# Patient Record
Sex: Female | Born: 1955
Health system: Southern US, Community
[De-identification: ages and names within clinical notes are randomized; demographics above are authoritative.]

## PROBLEM LIST (undated history)

## (undated) DIAGNOSIS — M1990 Unspecified osteoarthritis, unspecified site: Secondary | ICD-10-CM

## (undated) DIAGNOSIS — K219 Gastro-esophageal reflux disease without esophagitis: Secondary | ICD-10-CM

## (undated) DIAGNOSIS — T7840XA Allergy, unspecified, initial encounter: Secondary | ICD-10-CM

## (undated) DIAGNOSIS — Z860100 Personal history of colon polyps, unspecified: Secondary | ICD-10-CM

## (undated) DIAGNOSIS — Z808 Family history of malignant neoplasm of other organs or systems: Secondary | ICD-10-CM

## (undated) DIAGNOSIS — H269 Unspecified cataract: Secondary | ICD-10-CM

## (undated) DIAGNOSIS — I10 Essential (primary) hypertension: Secondary | ICD-10-CM

## (undated) HISTORY — DX: Unspecified osteoarthritis, unspecified site: M19.90

## (undated) HISTORY — PX: CATARACT EXTRACTION, BILATERAL: SHX1313

## (undated) HISTORY — PX: FRACTURE SURGERY: SHX138

## (undated) HISTORY — DX: Family history of malignant neoplasm of other organs or systems: Z80.8

## (undated) HISTORY — DX: Gastro-esophageal reflux disease without esophagitis: K21.9

## (undated) HISTORY — DX: Allergy, unspecified, initial encounter: T78.40XA

## (undated) HISTORY — DX: Unspecified cataract: H26.9

## (undated) HISTORY — PX: ELBOW FRACTURE SURGERY: SHX616

## (undated) HISTORY — DX: Personal history of colonic polyps: Z86.010

## (undated) HISTORY — DX: Essential (primary) hypertension: I10

## (undated) HISTORY — PX: JOINT REPLACEMENT: SHX530

## (undated) HISTORY — DX: Personal history of colon polyps, unspecified: Z86.0100

## (undated) HISTORY — PX: KNEE SURGERY: SHX244

## (undated) HISTORY — PX: WRIST SURGERY: SHX841

## (undated) HISTORY — PX: ABDOMINAL HYSTERECTOMY: SHX81

---

## 2006-06-28 ENCOUNTER — Encounter: Admission: RE | Admit: 2006-06-28 | Discharge: 2006-06-28 | Payer: Self-pay | Admitting: Internal Medicine

## 2014-06-07 ENCOUNTER — Encounter (HOSPITAL_COMMUNITY): Payer: Self-pay | Admitting: Emergency Medicine

## 2014-06-07 ENCOUNTER — Observation Stay (HOSPITAL_COMMUNITY)
Admission: EM | Admit: 2014-06-07 | Discharge: 2014-06-08 | Disposition: A | Discharge status: Home / Self Care | Payer: No Typology Code available for payment source | Attending: Otolaryngology | Admitting: Otolaryngology

## 2014-06-07 ENCOUNTER — Emergency Department (HOSPITAL_COMMUNITY): Payer: No Typology Code available for payment source

## 2014-06-07 DIAGNOSIS — R1310 Dysphagia, unspecified: Secondary | ICD-10-CM | POA: Insufficient documentation

## 2014-06-07 DIAGNOSIS — J36 Peritonsillar abscess: Principal | ICD-10-CM | POA: Insufficient documentation

## 2014-06-07 LAB — I-STAT CHEM 8, ED
BUN: 15 mg/dL (ref 6–23)
CALCIUM ION: 1.19 mmol/L (ref 1.13–1.30)
Chloride: 98 mmol/L (ref 96–112)
Creatinine, Ser: 0.6 mg/dL (ref 0.50–1.10)
GLUCOSE: 117 mg/dL — AB (ref 70–99)
HCT: 48 % — ABNORMAL HIGH (ref 36.0–46.0)
HEMOGLOBIN: 16.3 g/dL — AB (ref 12.0–15.0)
Potassium: 3.6 mmol/L (ref 3.5–5.1)
SODIUM: 139 mmol/L (ref 135–145)
TCO2: 23 mmol/L (ref 0–100)

## 2014-06-07 LAB — SURGICAL PCR SCREEN
MRSA, PCR: NEGATIVE
Staphylococcus aureus: POSITIVE — AB

## 2014-06-07 MED ORDER — DEXAMETHASONE SODIUM PHOSPHATE 10 MG/ML IJ SOLN
10.0000 mg | Freq: Once | INTRAMUSCULAR | Status: AC
  Administered 2014-06-07: 10 mg via INTRAVENOUS
  Filled 2014-06-07: qty 1

## 2014-06-07 MED ORDER — DEXTROSE-NACL 5-0.45 % IV SOLN
INTRAVENOUS | Status: DC
  Administered 2014-06-07 – 2014-06-08 (×2): via INTRAVENOUS

## 2014-06-07 MED ORDER — CEFTRIAXONE SODIUM 1 G IJ SOLR
1.0000 g | Freq: Once | INTRAMUSCULAR | Status: AC
  Administered 2014-06-07: 1 g via INTRAVENOUS
  Filled 2014-06-07: qty 10

## 2014-06-07 MED ORDER — FENTANYL CITRATE 0.05 MG/ML IJ SOLN
25.0000 ug | Freq: Once | INTRAMUSCULAR | Status: AC
  Administered 2014-06-07: 25 ug via INTRAVENOUS

## 2014-06-07 MED ORDER — SODIUM CHLORIDE 0.9 % IV BOLUS (SEPSIS)
1000.0000 mL | Freq: Once | INTRAVENOUS | Status: AC
Start: 2014-06-07 — End: 2014-06-07
  Administered 2014-06-07: 1000 mL via INTRAVENOUS

## 2014-06-07 MED ORDER — FENTANYL CITRATE 0.05 MG/ML IJ SOLN
25.0000 ug | Freq: Once | INTRAMUSCULAR | Status: AC
  Administered 2014-06-07: 25 ug via INTRAVENOUS
  Filled 2014-06-07: qty 2

## 2014-06-07 MED ORDER — IOHEXOL 300 MG/ML  SOLN
75.0000 mL | Freq: Once | INTRAMUSCULAR | Status: AC | PRN
  Administered 2014-06-07: 75 mL via INTRAVENOUS

## 2014-06-07 MED ORDER — HYDROCODONE-ACETAMINOPHEN 5-325 MG PO TABS: 1.0000 | ORAL_TABLET | ORAL | Status: DC | PRN

## 2014-06-07 MED ORDER — FENTANYL CITRATE 0.05 MG/ML IJ SOLN
25.0000 ug | Freq: Once | INTRAMUSCULAR | Status: DC
  Filled 2014-06-07: qty 2

## 2014-06-07 MED ORDER — ONDANSETRON HCL 4 MG PO TABS: 4.0000 mg | ORAL_TABLET | ORAL | Status: DC | PRN

## 2014-06-07 MED ORDER — FENTANYL CITRATE 0.05 MG/ML IJ SOLN
INTRAMUSCULAR | Status: AC
  Administered 2014-06-07: 25 ug via INTRAVENOUS
  Filled 2014-06-07: qty 2

## 2014-06-07 MED ORDER — CLINDAMYCIN PHOSPHATE 600 MG/50ML IV SOLN
600.0000 mg | Freq: Three times a day (TID) | INTRAVENOUS | Status: DC
  Administered 2014-06-07 – 2014-06-08 (×2): 600 mg via INTRAVENOUS
  Filled 2014-06-07 (×4): qty 50

## 2014-06-07 MED ORDER — ONDANSETRON HCL 4 MG/2ML IJ SOLN: 4.0000 mg | INTRAMUSCULAR | Status: DC | PRN

## 2014-06-07 MED ORDER — KETOROLAC TROMETHAMINE 30 MG/ML IJ SOLN
15.0000 mg | Freq: Once | INTRAMUSCULAR | Status: AC
  Administered 2014-06-07: 15 mg via INTRAVENOUS
  Filled 2014-06-07: qty 1

## 2014-06-07 NOTE — ED Provider Notes (Signed)
CSN: [id number]     Arrival date & time 06/07/14  1126 History   First MD Initiated Contact with Patient 06/07/14 1151     Chief Complaint  Patient presents with  . Sore Throat    HPI  Patient presents with concern of increasing swelling, pain, fullness about a new lesion in her throat. Symptoms began about 4 days ago, have progressed. There is associated difficulty swallowing, slight difficult he was speaking, and perception of difficulty breathing. No fever, chills, confusion, disorientation, nausea, vomiting. Fluid intake is difficult. Patient was well prior to the onset of symptoms. Since onset no relief with OTC medication or anything else. Patient went to urgent care, was referred here for further evaluation.  History reviewed. No pertinent past medical history. History reviewed. No pertinent past surgical history. No family history on file. History  Substance Use Topics  . Smoking status: Never Smoker   . Smokeless tobacco: Not on file  . Alcohol Use: Yes   OB History    No data available     Review of Systems  Constitutional:       Per HPI, otherwise negative  HENT:       Per HPI, otherwise negative  Respiratory:       Per HPI, otherwise negative  Cardiovascular:       Per HPI, otherwise negative  Gastrointestinal: Negative for vomiting.  Endocrine:       Negative aside from HPI  Genitourinary:       Neg aside from HPI   Musculoskeletal:       Per HPI, otherwise negative  Skin: Negative.   Neurological: Negative for syncope.      Allergies  Review of patient's allergies indicates not on file.  Home Medications   Prior to Admission medications   Not on File   BP 148/96 mmHg  Pulse 111  Temp(Src) 98.9 F (37.2 C)  Resp 18  Ht 5' 8.5" (1.74 m)  Wt 199 lb (90.266 kg)  BMI 29.81 kg/m2  SpO2 95% Physical Exam  Constitutional: She is oriented to person, place, and time. She appears well-developed and well-nourished. No distress.  HENT:  Head:  Normocephalic and atraumatic.  Posterior oropharynx is not visible, even with depressed tongue via tongue depressor.   Eyes: Conjunctivae and EOM are normal.  Neck:  There is a form palpable firmness in the submandibular area, symmetric, approximately 10 cm across.   Cardiovascular: Normal rate and regular rhythm.   Pulmonary/Chest: Effort normal and breath sounds normal. No stridor. No respiratory distress.  Abdominal: She exhibits no distension.  Musculoskeletal: She exhibits no edema.  Lymphadenopathy:    She has no cervical adenopathy.  Neurological: She is alert and oriented to person, place, and time. No cranial nerve deficit.  Skin: Skin is warm and dry.  Psychiatric: She has a normal mood and affect.  Nursing note and vitals reviewed.   ED Course  Procedures (including critical care time) Labs Review Labs Reviewed  I-STAT CHEM 8, ED - Abnormal; Notable for the following:    Glucose, Bld 117 (*)    Hemoglobin 16.3 (*)    HCT 48.0 (*)    All other components within normal limits    Imaging Review Ct Soft Tissue Neck W Contrast  06/07/2014   CLINICAL DATA:  RIGHT-sided Neck swelling, pain, redness, dysphagia, muffled voice, with worsening symptoms since onset 4 days ago. Initial encounter.  EXAM: CT NECK WITH CONTRAST  TECHNIQUE: Multidetector CT imaging of the neck was  performed using the standard protocol following the bolus administration of intravenous contrast.  CONTRAST:  42mL OMNIPAQUE IOHEXOL 300 MG/ML  SOLN  COMPARISON:  None.  FINDINGS: Pharynx and larynx: There is a complex inflammatory process in the RIGHT parapharyngeal space obscuring the fat planes. The RIGHT palatine tonsil is enlarged and inflamed. There are two separate areas of hypoattenuation within the RIGHT parapharyngeal soft tissues consistent with a multicompartmental peritonsillar abscess. The smaller more lateral and superior component measures 11 x 12 x 13 mm. The larger, more inferior component  measures 22 x 20 x 24 mm. The more inferior component mildly displaces the midline tongue, and effaces the RIGHT vallecula. Mild hypopharyngeal mass effect, but no impending airway obstruction. Mild edema of the RIGHT aryepiglottic fold. Vocal cords midline.  Salivary glands: Hyper enhancement of the RIGHT submandibular gland. The inferior component of the peritonsillar abscess lies adjacent to its medial aspect. There may be a degree of mild a RIGHT SMG obstruction or edema of the duct because of the peritonsillar abscess. LEFT SMG normal. Normal parotid glands.  Thyroid: Normal.  Lymph nodes: Regional reactive adenopathy. RIGHT level IIA node up to 10 mm short axis.  Vascular: No evidence for vascular thrombosis with specific attention to the RIGHT and IJ.  Limited intracranial: Negative.  Visualized orbits: Negative.  Mastoids and visualized paranasal sinuses: Unremarkable.  Skeleton: No osseous lesion. Spondylosis is moderately advanced at the C5-6 and C6-7 levels.  Upper chest: Unremarkable.  IMPRESSION: Multi compartmental peritonsillar abscess on the RIGHT. Regional adenopathy. No impending airway obstruction. RIGHT SMG inflammation or partial obstruction. Findings discussed with ordering provider.   Electronically Signed   By: Rolla Flatten M.D.   On: 06/07/2014 13:53  I reviewed the CT, agree with the interpretation.    On re-exam the patient states that she is more comfortable.  I discussed her case w  Dr. Janace Hoard, ENT.  3:54 PM Patient will be admitted by ENT, for continued ABX, I&D as needed.  MDM   Patient p/w increasing swelling, pain in throat.  Patient is awake and alert, w no respiratory compromise.  After reviewing the CT, discussing the case w ENT, starting steroids, ABX, analgesics, she was admitted for further E/M.     Carmin Muskrat, MD 06/07/14 [phone]

## 2014-06-07 NOTE — H&P (Signed)
Laura Kaufman is an 59 y.o. female.   Chief Complaint: Sore throat HPI: 59 year old who's had a sore throat since Wednesday of this week. No previous history of tonsillitis or issues with sore throats. She has worsened over the last few days to the point of last night was severely uncomfortable. She is unable to take fluids or solids. She was seen in the emergency room today and given Rocephin and steroids. At this point she states she feels 200% better. CT scan indicated 2 areas in the right peritonsillar region consistent with peritonsillar abscess 1 was approximately 1 cm and the other 2 cm..  History reviewed. No pertinent past medical history.  History reviewed. No pertinent past surgical history.  No family history on file. Social History:  reports that she has never smoked. She does not have any smokeless tobacco history on file. She reports that she drinks alcohol. She reports that she does not use illicit drugs.  Allergies:  Allergies  Allergen Reactions  . Bee Venom Anaphylaxis  . Penicillins Shortness Of Breath     (Not in a hospital admission)  Results for orders placed or performed during the hospital encounter of 06/07/14 (from the past 48 hour(s))  I-stat Chem 8, ED     Status: Abnormal   Collection Time: 06/07/14 12:29 PM  Result Value Ref Range   Sodium 139 135 - 145 mmol/L   Potassium 3.6 3.5 - 5.1 mmol/L   Chloride 98 96 - 112 mmol/L   BUN 15 6 - 23 mg/dL   Creatinine, Ser 0.60 0.50 - 1.10 mg/dL   Glucose, Bld 117 (H) 70 - 99 mg/dL   Calcium, Ion 1.19 1.13 - 1.30 mmol/L   TCO2 23 0 - 100 mmol/L   Hemoglobin 16.3 (H) 12.0 - 15.0 g/dL   HCT 48.0 (H) 36.0 - 46.0 %   Ct Soft Tissue Neck W Contrast  06/07/2014   CLINICAL DATA:  RIGHT-sided Neck swelling, pain, redness, dysphagia, muffled voice, with worsening symptoms since onset 4 days ago. Initial encounter.  EXAM: CT NECK WITH CONTRAST  TECHNIQUE: Multidetector CT imaging of the neck was performed using the  standard protocol following the bolus administration of intravenous contrast.  CONTRAST:  82mL OMNIPAQUE IOHEXOL 300 MG/ML  SOLN  COMPARISON:  None.  FINDINGS: Pharynx and larynx: There is a complex inflammatory process in the RIGHT parapharyngeal space obscuring the fat planes. The RIGHT palatine tonsil is enlarged and inflamed. There are two separate areas of hypoattenuation within the RIGHT parapharyngeal soft tissues consistent with a multicompartmental peritonsillar abscess. The smaller more lateral and superior component measures 11 x 12 x 13 mm. The larger, more inferior component measures 22 x 20 x 24 mm. The more inferior component mildly displaces the midline tongue, and effaces the RIGHT vallecula. Mild hypopharyngeal mass effect, but no impending airway obstruction. Mild edema of the RIGHT aryepiglottic fold. Vocal cords midline.  Salivary glands: Hyper enhancement of the RIGHT submandibular gland. The inferior component of the peritonsillar abscess lies adjacent to its medial aspect. There may be a degree of mild a RIGHT SMG obstruction or edema of the duct because of the peritonsillar abscess. LEFT SMG normal. Normal parotid glands.  Thyroid: Normal.  Lymph nodes: Regional reactive adenopathy. RIGHT level IIA node up to 10 mm short axis.  Vascular: No evidence for vascular thrombosis with specific attention to the RIGHT and IJ.  Limited intracranial: Negative.  Visualized orbits: Negative.  Mastoids and visualized paranasal sinuses: Unremarkable.  Skeleton: No osseous  lesion. Spondylosis is moderately advanced at the C5-6 and C6-7 levels.  Upper chest: Unremarkable.  IMPRESSION: Multi compartmental peritonsillar abscess on the RIGHT. Regional adenopathy. No impending airway obstruction. RIGHT SMG inflammation or partial obstruction. Findings discussed with ordering provider.   Electronically Signed   By: Rolla Flatten M.D.   On: 06/07/2014 13:53    Review of Systems  Constitutional: Negative.    HENT: Positive for sore throat.   Eyes: Negative.   Respiratory: Negative.   Cardiovascular: Negative.   Skin: Negative.     Blood pressure 151/83, pulse 99, temperature 98.9 F (37.2 C), resp. rate 18, height 5' 8.5" (1.74 m), weight 90.266 kg (199 lb), SpO2 97 %. Physical Exam  Constitutional: She appears well-developed and well-nourished.  HENT:  Awake and alert. Seems to be in no distress. No stridor. Her voice sounds mostly normal but does have a very slight hot potato sound. She has no trismus. There is erythema along the right tonsil area and soft palate. There is edema of the uvula. She does have a difficult exam because she has a sensitive gag reflex and a high riding tongue. Neck is thickened but no obvious abscess palpable.  Eyes: Pupils are equal, round, and reactive to light.  Cardiovascular: Normal rate.   Respiratory: Effort normal.  GI: Soft.  Musculoskeletal: Normal range of motion.     Assessment/Plan Right peritonsillar abscess-he has erythema and swelling of the right peritonsillar region by exam but it is difficult to visualize her tonsil area and I can only see the very superior aspect of the tonsil. To perform an incision and drainage of this area especially with the inferior located abscess pocket she would require general anesthesia. We talked about this procedure. Since she is so much better since coming to the emergency room and receiving antibiotics as well as steroids observation with intravenous antibiotics also would be a acceptable option. She will prefers to proceed with the admission and observation. She will remain nothing by mouth after midnight just in case we need to proceed with the incision and drainage.  [name] 06/07/2014, 3:50 PM

## 2014-06-07 NOTE — ED Notes (Signed)
Pt. 's husband stated, We went to [facility],. And was sent here,  Really sore and tender to touch, a big infection. Making it hard to breath.

## 2014-06-08 MED ORDER — CETYLPYRIDINIUM CHLORIDE 0.05 % MT LIQD: 7.0000 mL | Freq: Two times a day (BID) | OROMUCOSAL | Status: DC

## 2014-06-08 MED ORDER — MUPIROCIN 2 % EX OINT: 1.0000 "application " | TOPICAL_OINTMENT | Freq: Two times a day (BID) | CUTANEOUS | Status: DC

## 2014-06-08 MED ORDER — CLINDAMYCIN HCL 300 MG PO CAPS: 300.0000 mg | ORAL_CAPSULE | Freq: Three times a day (TID) | ORAL | Status: DC

## 2014-06-08 MED ORDER — CHLORHEXIDINE GLUCONATE CLOTH 2 % EX PADS: 6.0000 | MEDICATED_PAD | Freq: Every day | CUTANEOUS | Status: DC

## 2014-06-08 NOTE — Progress Notes (Signed)
Pt discharged to home accomp by family.  DC instructions copy given and reviewed.  Rx called into pharmacy by Dr. Janace Hoard.  Pt is to see Dr. Janace Hoard this week, emphasized to drink plenty of fluids.  Pt sat on RA was 95%.

## 2014-06-08 NOTE — Progress Notes (Addendum)
Subjective: He feels extensively better. She's not having any fever. She feels like she can swallow her saliva.  Objective: Vital signs in last 24 hours: Temp:  [97.4 F (36.3 C)-98.9 F (37.2 C)] 98.3 F (36.8 C) (03/20 0641) Pulse Rate:  [73-112] 87 (03/20 0641) Resp:  [18] 18 (03/20 0641) BP: (122-161)/(76-96) 129/84 mmHg (03/20 0641) SpO2:  [92 %-100 %] 95 % (03/20 0641) Weight:  [90.266 kg (199 lb)] 90.266 kg (199 lb) (03/19 1815) Last BM Date: 06/05/14  Intake/Output from previous day: 03/19 0701 - 03/20 0700 In: 1729.2 [I.V.:1629.2; IV Piggyback:100] Out: -  Intake/Output this shift:    Awake and alert. She has a normal sounding voice. She looks good with no evidence of any distress. Nose is clear. Oral cavity/oropharynx-the exam is much easier. The uvula looks normal. Both tonsils are about +2 and both have small amount of exudate. I can see almost all of the tonsils now were yesterday I could not. The neck is significant decrease in swelling and no mass.  Lab Results:   Recent Labs  06/07/14 1229  HGB 16.3*  HCT 48.0*   BMET  Recent Labs  06/07/14 1229  NA 139  K 3.6  CL 98  GLUCOSE 117*  BUN 15  CREATININE 0.60   PT/INR No results for input(s): LABPROT, INR in the last 72 hours. ABG No results for input(s): PHART, HCO3 in the last 72 hours.  Invalid input(s): PCO2, PO2  Studies/Results: Ct Soft Tissue Neck W Contrast  06/07/2014   CLINICAL DATA:  RIGHT-sided Neck swelling, pain, redness, dysphagia, muffled voice, with worsening symptoms since onset 4 days ago. Initial encounter.  EXAM: CT NECK WITH CONTRAST  TECHNIQUE: Multidetector CT imaging of the neck was performed using the standard protocol following the bolus administration of intravenous contrast.  CONTRAST:  3mL OMNIPAQUE IOHEXOL 300 MG/ML  SOLN  COMPARISON:  None.  FINDINGS: Pharynx and larynx: There is a complex inflammatory process in the RIGHT parapharyngeal space obscuring the fat  planes. The RIGHT palatine tonsil is enlarged and inflamed. There are two separate areas of hypoattenuation within the RIGHT parapharyngeal soft tissues consistent with a multicompartmental peritonsillar abscess. The smaller more lateral and superior component measures 11 x 12 x 13 mm. The larger, more inferior component measures 22 x 20 x 24 mm. The more inferior component mildly displaces the midline tongue, and effaces the RIGHT vallecula. Mild hypopharyngeal mass effect, but no impending airway obstruction. Mild edema of the RIGHT aryepiglottic fold. Vocal cords midline.  Salivary glands: Hyper enhancement of the RIGHT submandibular gland. The inferior component of the peritonsillar abscess lies adjacent to its medial aspect. There may be a degree of mild a RIGHT SMG obstruction or edema of the duct because of the peritonsillar abscess. LEFT SMG normal. Normal parotid glands.  Thyroid: Normal.  Lymph nodes: Regional reactive adenopathy. RIGHT level IIA node up to 10 mm short axis.  Vascular: No evidence for vascular thrombosis with specific attention to the RIGHT and IJ.  Limited intracranial: Negative.  Visualized orbits: Negative.  Mastoids and visualized paranasal sinuses: Unremarkable.  Skeleton: No osseous lesion. Spondylosis is moderately advanced at the C5-6 and C6-7 levels.  Upper chest: Unremarkable.  IMPRESSION: Multi compartmental peritonsillar abscess on the RIGHT. Regional adenopathy. No impending airway obstruction. RIGHT SMG inflammation or partial obstruction. Findings discussed with ordering provider.   Electronically Signed   By: Rolla Flatten M.D.   On: 06/07/2014 13:53    Anti-infectives: Anti-infectives    Start  Dose/Rate Route Frequency Ordered Stop   06/07/14 2200  clindamycin (CLEOCIN) IVPB 600 mg     600 mg 100 mL/hr over 30 Minutes Intravenous 3 times per day 06/07/14 1750     06/07/14 1400  cefTRIAXone (ROCEPHIN) 1 g in dextrose 5 % 50 mL IVPB     1 g 100 mL/hr over 30  Minutes Intravenous  Once 06/07/14 1359 06/07/14 1443      Assessment/Plan: s/p * No surgery found * We talked about her situation and her abscess that showed up on the CT scan. Since she is so much better and exam is somewhat better then an option of continued medical therapy would be appropriate. She will go home on clindamycin. She will follow-up if she notices any decrease in her improvement or any regression where she gets worse. She will follow-up in the office next week provided she continues to improve every day to the point where she should be almost normal by Tuesday.     [name] 06/08/2014 She was having some apparent decrease in her O2 sat and some nasal cannulas were placed. She will remove those and if her sats continued to drop then she will need a medical consultation for evaluation of her lungs as this is not secondary to any upper airway issue.

## 2014-06-08 NOTE — Discharge Summary (Signed)
Physician Discharge Summary  Patient ID: Laura Kaufman MRN: [id number] DOB/AGE: [birth year] 59 y.o.  Admit date: 06/07/2014 Discharge date: 06/08/2014  Admission Diagnoses: Right peritonsillar abscess  Discharge Diagnoses: Same Active Problems:   Peritonsillar abscess   Discharged Condition: good  Hospital Course: Patient was admitted to the hospital for intravenous antibiotics with a right small peritonsillar abscess. She has dramatically improved since admission and now feels like she can go home and take fluids well. She had a much better examination on hospital day 1 and was discharged on clindamycin to follow-up in the office this week. She will follow-up sooner if she has any worsening or stabilization of her symptoms. She was needing a small amount of oxygen so that will be removed and see if her sats stay up. If not she will need a medical consultation before discharge.  Consults: None  Significant Diagnostic Studies: 0  Treatments: 0  Discharge Exam: Blood pressure 129/84, pulse 87, temperature 98.3 F (36.8 C), temperature source Oral, resp. rate 18, height 5' 8.5" (1.74 m), weight 90.266 kg (199 lb), SpO2 95 %. Awake and alert. No distress. Voice is now completely normal. Oral cavity/oropharynx-the exam is much better able to see the full tonsil exam bilaterally. Both tonsils have a small amount of exudate. They're both about the same size. The uvula has no edema. Neck is without significant swelling or mass. Cardiovascular is regular. Lungs are clear. extremities without tenderness or swelling  Disposition: Final discharge disposition not confirmed  Discharge Instructions    Call MD for:  difficulty breathing, headache or visual disturbances    Complete by:  As directed      Call MD for:  extreme fatigue    Complete by:  As directed      Call MD for:  hives    Complete by:  As directed      Call MD for:  persistant dizziness or light-headedness    Complete by:  As  directed      Call MD for:  persistant nausea and vomiting    Complete by:  As directed      Call MD for:  redness, tenderness, or signs of infection (pain, swelling, redness, odor or green/yellow discharge around incision site)    Complete by:  As directed      Call MD for:  severe uncontrolled pain    Complete by:  As directed      Call MD for:  temperature >100.4    Complete by:  As directed      Diet - low sodium heart healthy    Complete by:  As directed      Discharge instructions    Complete by:  As directed   You should improve every day and be almost back to normal by 24-48 hours. Follow-up in my office this week. Call if there's any worsening or if you are not almost totally better by Tuesday. Normal diet.     Increase activity slowly    Complete by:  As directed             Medication List    TAKE these medications        clindamycin 300 MG capsule  Commonly known as:  CLEOCIN  Take 1 capsule (300 mg total) by mouth 3 (three) times daily.     naproxen sodium 220 MG tablet  Commonly known as:  ANAPROX  Take 440 mg by mouth 2 (two) times daily with a meal.  SignedMelissa Montane 06/08/2014, 9:35 AM

## 2014-06-08 NOTE — Progress Notes (Signed)
UR completed 

## 2014-12-18 ENCOUNTER — Emergency Department (HOSPITAL_COMMUNITY): Payer: No Typology Code available for payment source

## 2014-12-18 ENCOUNTER — Observation Stay (HOSPITAL_COMMUNITY)
Admission: EM | Admit: 2014-12-18 | Discharge: 2014-12-21 | Disposition: A | Discharge status: Home / Self Care | Payer: No Typology Code available for payment source | Attending: Family Medicine | Admitting: Family Medicine

## 2014-12-18 ENCOUNTER — Encounter (HOSPITAL_COMMUNITY): Payer: Self-pay

## 2014-12-18 DIAGNOSIS — Y9389 Activity, other specified: Secondary | ICD-10-CM | POA: Insufficient documentation

## 2014-12-18 DIAGNOSIS — Z91030 Bee allergy status: Secondary | ICD-10-CM | POA: Diagnosis not present

## 2014-12-18 DIAGNOSIS — M545 Low back pain, unspecified: Secondary | ICD-10-CM

## 2014-12-18 DIAGNOSIS — Y92010 Kitchen of single-family (private) house as the place of occurrence of the external cause: Secondary | ICD-10-CM | POA: Insufficient documentation

## 2014-12-18 DIAGNOSIS — W19XXXA Unspecified fall, initial encounter: Secondary | ICD-10-CM | POA: Insufficient documentation

## 2014-12-18 DIAGNOSIS — J309 Allergic rhinitis, unspecified: Secondary | ICD-10-CM | POA: Diagnosis not present

## 2014-12-18 DIAGNOSIS — K5900 Constipation, unspecified: Secondary | ICD-10-CM | POA: Diagnosis not present

## 2014-12-18 DIAGNOSIS — Y998 Other external cause status: Secondary | ICD-10-CM | POA: Insufficient documentation

## 2014-12-18 DIAGNOSIS — S32000A Wedge compression fracture of unspecified lumbar vertebra, initial encounter for closed fracture: Secondary | ICD-10-CM | POA: Diagnosis present

## 2014-12-18 DIAGNOSIS — S22088A Other fracture of T11-T12 vertebra, initial encounter for closed fracture: Secondary | ICD-10-CM | POA: Diagnosis not present

## 2014-12-18 DIAGNOSIS — S32018A Other fracture of first lumbar vertebra, initial encounter for closed fracture: Secondary | ICD-10-CM | POA: Diagnosis not present

## 2014-12-18 DIAGNOSIS — Z90710 Acquired absence of both cervix and uterus: Secondary | ICD-10-CM | POA: Insufficient documentation

## 2014-12-18 DIAGNOSIS — R339 Retention of urine, unspecified: Secondary | ICD-10-CM | POA: Insufficient documentation

## 2014-12-18 DIAGNOSIS — Z880 Allergy status to penicillin: Secondary | ICD-10-CM | POA: Diagnosis not present

## 2014-12-18 DIAGNOSIS — R338 Other retention of urine: Secondary | ICD-10-CM | POA: Insufficient documentation

## 2014-12-18 DIAGNOSIS — M549 Dorsalgia, unspecified: Secondary | ICD-10-CM | POA: Diagnosis present

## 2014-12-18 DIAGNOSIS — W010XXA Fall on same level from slipping, tripping and stumbling without subsequent striking against object, initial encounter: Secondary | ICD-10-CM

## 2014-12-18 DIAGNOSIS — S32019A Unspecified fracture of first lumbar vertebra, initial encounter for closed fracture: Secondary | ICD-10-CM | POA: Diagnosis present

## 2014-12-18 LAB — BASIC METABOLIC PANEL
Anion gap: 10 (ref 5–15)
BUN: 7 mg/dL (ref 6–20)
CO2: 25 mmol/L (ref 22–32)
CREATININE: 0.83 mg/dL (ref 0.44–1.00)
Calcium: 9.7 mg/dL (ref 8.9–10.3)
Chloride: 104 mmol/L (ref 101–111)
GFR calc Af Amer: >60 mL/min (ref >60)
GFR calc non Af Amer: >60 mL/min (ref >60)
GLUCOSE: 140 mg/dL — AB (ref 65–99)
Potassium: 4 mmol/L (ref 3.5–5.1)
Sodium: 139 mmol/L (ref 135–145)

## 2014-12-18 LAB — CBC
HEMATOCRIT: 46.3 % — AB (ref 36.0–46.0)
Hemoglobin: 15.9 g/dL — ABNORMAL HIGH (ref 12.0–15.0)
MCH: 28.4 pg (ref 26.0–34.0)
MCHC: 34.3 g/dL (ref 30.0–36.0)
MCV: 82.8 fL (ref 78.0–100.0)
Platelets: 205 10*3/uL (ref 150–400)
RBC: 5.59 MIL/uL — ABNORMAL HIGH (ref 3.87–5.11)
RDW: 12.7 % (ref 11.5–15.5)
WBC: 15.5 10*3/uL — ABNORMAL HIGH (ref 4.0–10.5)

## 2014-12-18 MED ORDER — OXYCODONE-ACETAMINOPHEN 5-325 MG PO TABS
1.0000 | ORAL_TABLET | Freq: Once | ORAL | Status: AC
  Administered 2014-12-18: 1 via ORAL
  Filled 2014-12-18: qty 1

## 2014-12-18 MED ORDER — POLYETHYLENE GLYCOL 3350 17 G PO PACK
17.0000 g | PACK | Freq: Every day | ORAL | Status: DC | PRN
  Administered 2014-12-20: 17 g via ORAL
  Filled 2014-12-18: qty 1

## 2014-12-18 MED ORDER — HYDROMORPHONE HCL 1 MG/ML IJ SOLN
0.5000 mg | INTRAMUSCULAR | Status: DC
  Administered 2014-12-18 – 2014-12-19 (×4): 0.5 mg via INTRAVENOUS
  Filled 2014-12-18 (×4): qty 1

## 2014-12-18 MED ORDER — HYDROMORPHONE HCL 1 MG/ML IJ SOLN
1.0000 mg | Freq: Once | INTRAMUSCULAR | Status: AC
  Administered 2014-12-18: 1 mg via INTRAMUSCULAR
  Filled 2014-12-18: qty 1

## 2014-12-18 MED ORDER — SODIUM CHLORIDE 0.9 % IV SOLN
Freq: Once | INTRAVENOUS | Status: AC
  Administered 2014-12-18: 21:00:00 via INTRAVENOUS

## 2014-12-18 MED ORDER — METHOCARBAMOL 500 MG PO TABS
500.0000 mg | ORAL_TABLET | Freq: Once | ORAL | Status: AC
  Administered 2014-12-18: 500 mg via ORAL
  Filled 2014-12-18: qty 1

## 2014-12-18 MED ORDER — HYDROMORPHONE HCL 1 MG/ML IJ SOLN
1.0000 mg | INTRAMUSCULAR | Status: DC | PRN
  Administered 2014-12-18 – 2014-12-19 (×3): 1 mg via INTRAVENOUS
  Filled 2014-12-18 (×3): qty 1

## 2014-12-18 MED ORDER — HYDROMORPHONE HCL 1 MG/ML IJ SOLN
1.0000 mg | Freq: Once | INTRAMUSCULAR | Status: AC
  Administered 2014-12-18: 1 mg via INTRAVENOUS
  Filled 2014-12-18: qty 1

## 2014-12-18 MED ORDER — NAPROXEN 250 MG PO TABS
500.0000 mg | ORAL_TABLET | Freq: Two times a day (BID) | ORAL | Status: DC
  Administered 2014-12-19 – 2014-12-21 (×5): 500 mg via ORAL
  Filled 2014-12-18 (×5): qty 2

## 2014-12-18 MED ORDER — DIPHENHYDRAMINE HCL 25 MG PO CAPS
50.0000 mg | ORAL_CAPSULE | Freq: Once | ORAL | Status: AC
  Administered 2014-12-18: 50 mg via ORAL
  Filled 2014-12-18: qty 2

## 2014-12-18 MED ORDER — DIPHENHYDRAMINE HCL 25 MG PO CAPS: 50.0000 mg | ORAL_CAPSULE | Freq: Four times a day (QID) | ORAL | Status: DC | PRN

## 2014-12-18 MED ORDER — ENOXAPARIN SODIUM 40 MG/0.4ML ~~LOC~~ SOLN
40.0000 mg | SUBCUTANEOUS | Status: DC
Start: 2014-12-18 — End: 2014-12-21
  Administered 2014-12-18 – 2014-12-20 (×3): 40 mg via SUBCUTANEOUS
  Filled 2014-12-18 (×3): qty 0.4

## 2014-12-18 NOTE — ED Notes (Signed)
Patient transported to CT 

## 2014-12-18 NOTE — ED Notes (Signed)
ortho tech paged. 

## 2014-12-18 NOTE — H&P (Signed)
[facility] Admission History and Physical Service Pager: [phone]  Patient name: Laura Kaufman Medical record number: [id number] Date of birth: 29-May-1955 Age: 59 y.o. Gender: female  Primary Care Provider: No PCP Per Patient Consultants: None Code Status: Full   Chief Complaint: Back Pain   Assessment and Plan: Elfrida Gracy is a 59 y.o. female presenting with back pain s/p fall. PMH is significant for allergic rhinitis, s/p patella replacement, and right peritonsillar abscess.   T12 & L1 Compression Fractures: Back pain associated with recent fall. Lumbar spine Xray showed endplate degenerative change with inferior T12 and superior L1 minimal endplate depression, no bony retropulsion. Xray of thoracic spine was negative. CT lumbar spine: Acute anterior compression fracture of T12 vertebral body with minimal anterior height loss, age-indeterminate superior endplate anterior compression deformity of L1, and bilateral spondylolysis L5 without spondylolisthesis. Neurosurgery recommended lumbar corset. Patient had inadequate pain control in the ED. -admit to MedSurg; vital signs per unit -pain control: Dilaudid 0.5mg  q4h, Dilaudid 1mg  q3h for breakthrough pain and Naproxen 500 mg BID  -heating bad to back prn  -continue with lumbar corset -Miralax prn for constipation 2/2 to pain medication  -PT consult   Elevated Blood Pressure: Patient does not have a diagnosis of HTN. Pressures were stable (120s-140s/70-80s) throughout the day in the ED. Had two elevated pressures in the ED with systolics in the 371G reportedly when patient was having IV placed. Likely elevated 2/2 to pain. Suspect will improve with adequate pain control.  -will continue to monitor  Leukocytosis: WBC 15.5. Patient is afebrile and not exhibiting any signs of infection. RBC, HgB and Hct elevated as well so suspect related to hemoconcentration 2/2 to dehydration.  -NS 588mL bolus  -encouraged  good PO intake -repeat CBC in AM   Allergic Rhinitis -continue home Benadryl   FEN/GI: Regular Diet, NS 500 mL bolus then SLIV Prophylaxis: Lovenox 40mg    Disposition: Admit to MedSurg for Observation; FPTS [place]; Home pending pain control and PT consult   History of Present Illness:  Laura Kaufman is a 59 y.o. female presenting with back pain s/p fall this AM around 6:30. She slipped on cat urine and fell backwards, landing on her back and hitting her head. She denies LOC s/p fall. Denies headache or neck pain. Back pain does not radiate down her legs. No numbness or tingling in arms or legs. No bowel or bladder incontinence after fall. Pain is 10/10 without pain medication and 5/10 after dilaudid.   CT of lumbar spine demonstrated compression fractures of T12 and L1. Neurosurgery was asked to evaluate the patient. They recommended lumbar corset. She was given Dilaudid 1 mg x 3 doses, Percocet 5-325 mg x 1 dose, and Robaxin 500 mg x 1 dose in ED without adequate pain control.   She has not eaten for most of today or taken adequate fluids. When she tried to ambulate in the ED, she felt light headed.   Review Of Systems: Per HPI with the following additions: None  Otherwise 12 point review of systems was performed and was unremarkable.  Patient Active Problem List   Diagnosis Date Noted  . L1 vertebral fracture 12/18/2014  . Peritonsillar abscess 06/07/2014   Past Medical History: History reviewed. No pertinent past medical history. Past Surgical History: Past Surgical History  Procedure Laterality Date  . Abdominal hysterectomy    . Knee surgery    . Wrist surgery     Social History: Social History  Substance Use  Topics  . Smoking status: Never Smoker   . Smokeless tobacco: None  . Alcohol Use: Yes     Comment: occasionally   Additional social history: none   Please also refer to relevant sections of EMR.  Family History: History reviewed. No pertinent family  history. Allergies and Medications: Allergies  Allergen Reactions  . Bee Venom Anaphylaxis  . Penicillins Anaphylaxis and Shortness Of Breath    Has patient had a PCN reaction causing immediate rash, facial/tongue/throat swelling, SOB or lightheadedness with hypotension: Yesyes Has patient had a PCN reaction causing severe rash involving mucus membranes or skin necrosis: Yesyes Has patient had a PCN reaction that required hospitalization Yesyes Has patient had a PCN reaction occurring within the last 10 years: Jolyne Loa If all of the above answers are "NO", then may proceed with Cephalospor   No current facility-administered medications on file prior to encounter.   Current Outpatient Prescriptions on File Prior to Encounter  Medication Sig Dispense Refill  . clindamycin (CLEOCIN) 300 MG capsule Take 1 capsule (300 mg total) by mouth 3 (three) times daily. (Patient not taking: Reported on 12/18/2014) 30 capsule 0  . naproxen sodium (ANAPROX) 220 MG tablet Take 440 mg by mouth 2 (two) times daily with a meal.      Objective: BP 160/102 mmHg  Pulse 94  Temp(Src) 98.3 F (36.8 C) (Oral)  Resp 18  Ht 5\' 5"  (1.651 m)  Wt 210 lb (95.255 kg)  BMI 34.95 kg/m2  SpO2 93% Exam: General: WDWN female lying in bed in NAD  Eyes: EOMI. Pupils equal and round.  ENTM: Oropharynx clear.  Neck: Full ROM.  Cardiovascular: RRR. No murmurs appreciated.  Respiratory: CTAB anterior  Abdomen: soft, NTND  MSK: Moves all 4 extremities.  Skin: Warm and dry  Neuro: Alert and oriented. No gross deficits. Strength 5/5 in UE and LE bilaterally.  Psych: Tearful. Answers questions appropriately.   Labs and Imaging: CBC BMET   Recent Labs Lab 12/18/14 1535  WBC 15.5*  HGB 15.9*  HCT 46.3*  PLT 205    Recent Labs Lab 12/18/14 1535  NA 139  K 4.0  CL 104  CO2 25  BUN 7  CREATININE 0.83  GLUCOSE 140*  CALCIUM 9.7     Dg Thoracic Spine 2 View  12/18/2014   CLINICAL DATA:  Slipped and fell, mid  to low back pain  EXAM: THORACIC SPINE 2 VIEWS  COMPARISON:  None.  FINDINGS: There is no evidence of thoracic spine fracture. Alignment is normal. No other significant bone abnormalities are identified.  IMPRESSION: Negative.   Electronically Signed   By: Conchita Paris M.D.   On: 12/18/2014 13:14   Dg Lumbar Spine Complete  12/18/2014   CLINICAL DATA:  Slipped and fell, mid to low back pain, stiffness and limited range of motion  EXAM: LUMBAR SPINE - COMPLETE 4+ VIEW  COMPARISON:  None.  FINDINGS: 5 non rib-bearing lumbar type vertebral bodies are identified. Endplate degenerative change with minimal template compression deformity at inferior aspect T12 and superior aspect L1 noted. No bony retropulsion. Alignment is within normal limits. Minimal atheromatous aortic calcification. Probable gallstone partly visualized.  IMPRESSION: Endplate degenerative change with inferior T12 and superior L1 minimal endplate depression, of unknown chronicity. No bony retropulsion.   Electronically Signed   By: Conchita Paris M.D.   On: 12/18/2014 13:26   Ct Lumbar Spine Wo Contrast  12/18/2014   CLINICAL DATA:  [place] this morning, had to crawl through house  to get a phone, T12-L1 fracture  EXAM: CT LUMBAR SPINE WITHOUT CONTRAST  TECHNIQUE: Multidetector CT imaging of the lumbar spine was performed without intravenous contrast administration. Multiplanar CT image reconstructions were also generated.  COMPARISON:  Radiographs 12/18/2014  FINDINGS: Five non rib-bearing lumbar type vertebra by prior radiographs.  Minimal atherosclerotic calcifications.  Visualized retroperitoneal structures otherwise unremarkable.  Bones appear demineralized.  Anterior compression fracture T12 involving the inferior endplate with additional mild superior endplate concavity/compression deformity, resulting in mild anterior height loss.  No retropulsion of fragments.  Mild anterior height loss L1 vertebral body due to age indeterminate superior  endplate deformity.  Remaining lumbar vertebra demonstrate normal height and alignment.  No subluxation or bone destruction.  BILATERAL spondylolysis L5 without spondylolisthesis.  Minimally bulging discs inferior lumbar spine without neural compression or definite focal disc herniation  IMPRESSION: Acute anterior compression fracture of T12 vertebral body with minimal anterior height loss.  Age-indeterminate superior endplate anterior compression deformity of L1.  BILATERAL spondylolysis L5 without spondylolisthesis.   Electronically Signed   By: Lavonia Dana M.D.   On: 12/18/2014 16:55    Nicolette Bang, DO 12/18/2014, 7:26 PM PGY-1, [place] Intern pager: [phone], text pages welcome  I have read and agree with the amended note as above.  Phill Myron, MD, PGY-3 2:20 AM

## 2014-12-18 NOTE — ED Provider Notes (Signed)
S: Laura Kaufman is a 59 y.o. female presents to the ED with mid low back pain after a fall this morning. Patient with mechanical fall after slipping on a wet spot. She reports that her back hurt so much that she was unable to stand on her own. EMS was called and brought her here to the emergency room. Patient reports she did hit her head but had no loss of consciousness. She has no vision changes, headache, weakness, numbness. She does not take blood thinners.  O:  General: Awake  HEENT: Atraumatic  Resp: Normal effort  Cardiac: RRR Abd: Nondistended, soft  Neuro:No focal weakness  MSK: midline T12-L1   A/P:  Pt with focal midline back pain after fall. Will obtain plain films as I have concern for fracture. No neurologic deficits.  2:31 PM X-ray with T12-L1 endplate deformity. Patient has pinpoint tenderness here and this is likely a new fracture. Will consult with neurosurgery for brace recommendation and to ensure outpatient follow-up.  3:34 PM Pt discussed with Dr. Beatris Ship.  CT L-spine ordered to assess for further fracture.  Lumbar corset insufficient if fracture is small however if patient has burst fracture she will need aspirin TLSO brace.  Further pain control given.  Care transferred to Dr. Regenia Skeeter.  If patient is able to ambulate after increased pain control and reassuring CT she may be discharged home otherwise she will need admission for further pain control. Patient remains without neurologic deficit.  BP 130/72 mmHg  Pulse 96  Temp(Src) 98.3 F (36.8 C) (Oral)  Resp 19  Ht 5\' 5"  (1.651 m)  Wt 210 lb (95.255 kg)  BMI 34.95 kg/m2  SpO2 90%  Pt was seen by Arlean Hopping, PA-C and personally evaluated by myself with Evelina Bucy, MD supervising.      Jarrett Soho Muthersbaugh, PA-C 12/18/14 [place], MD 12/18/14 [phone]

## 2014-12-18 NOTE — ED Notes (Signed)
[employer] at bedside. 

## 2014-12-18 NOTE — ED Notes (Signed)
Unable to obtain os vs due to pain, unable to ambulate for same reason.

## 2014-12-18 NOTE — Progress Notes (Signed)
Orthopedic Tech Progress Note Patient Details:  Laura Kaufman 03-08-1956 [id number] Called [employer] for brace Patient ID: Laura Kaufman, female   DOB: Jun 14, 1955, 59 y.o.   MRN: [id number]   Braulio Bosch 12/18/2014, 3:31 PM

## 2014-12-18 NOTE — ED Provider Notes (Signed)
CSN: [id number]     Arrival date & time 12/18/14  1159 History   First MD Initiated Contact with Patient 12/18/14 1201     Chief Complaint  Patient presents with  . Fall     Patient is a 59 y.o. female presenting with back pain and fall.  Back Pain Location:  Lumbar spine and sacro-iliac joint Quality:  Aching Radiates to:  Does not radiate Pain severity:  Mild Pain is:  Same all the time Onset quality:  Sudden Duration:  6 hours Timing:  Constant Progression:  Unchanged Chronicity:  New Context: falling   Relieved by:  Nothing Worsened by:  Movement and palpation Ineffective treatments:  Being still Associated symptoms: no abdominal pain, no bladder incontinence, no bowel incontinence, no headaches, no leg pain, no numbness, no pelvic pain, no perianal numbness, no tingling and no weakness   Risk factors: obesity   Fall This is a new problem. The current episode started today. Pertinent negatives include no abdominal pain, diaphoresis, headaches, neck pain, numbness or weakness.     Laura Kaufman is a 59 y.o. female presents today with lower back pain following a slip and fall at around 0630 this morning. Pt slipped on a puddle of liquid on the hardwood floor, fell backwards, and hit her head on the floor.  No loss of consciousness, dizziness/lightheadedness, nausea/vomiting. Pt denies any numbness, tingling or weakness. Pt denies taking any anticoagulation medication. Pt denies any previous falls or back injuries. Patient states she feels more comfortable lying on her side. Rates her pain as a 2/10 when lying on her back, "over a 5/10" when moving, and 0/10 when lying on her side.  History reviewed. No pertinent past medical history. Past Surgical History  Procedure Laterality Date  . Abdominal hysterectomy    . Knee surgery    . Wrist surgery     History reviewed. No pertinent family history. Social History  Substance Use Topics  . Smoking status: Never Smoker   .  Smokeless tobacco: None  . Alcohol Use: Yes     Comment: occasionally   OB History    No data available     Review of Systems  Constitutional: Negative for diaphoresis.  Eyes: Negative for visual disturbance.  Respiratory: Negative for shortness of breath.   Gastrointestinal: Negative for abdominal pain and bowel incontinence.  Genitourinary: Negative for bladder incontinence and pelvic pain.  Musculoskeletal: Positive for back pain. Negative for neck pain and neck stiffness.  Neurological: Negative for dizziness, tingling, syncope, speech difficulty, weakness, numbness and headaches.  All other systems reviewed and are negative.     Allergies  Bee venom and Penicillins  Home Medications   Prior to Admission medications   Medication Sig Start Date End Date Taking? Authorizing Provider  diphenhydrAMINE (BENADRYL) 25 mg capsule Take 50 mg by mouth every 6 (six) hours as needed.   Yes Historical Provider, MD  pseudoephedrine (SUDAFED) 30 MG tablet Take 30 mg by mouth every 4 (four) hours as needed for congestion (allergies).   Yes Historical Provider, MD  clindamycin (CLEOCIN) 300 MG capsule Take 1 capsule (300 mg total) by mouth 3 (three) times daily. Patient not taking: Reported on 12/18/2014 06/08/14   [name], MD  naproxen sodium (ANAPROX) 220 MG tablet Take 440 mg by mouth 2 (two) times daily with a meal.    Historical Provider, MD   BP 130/72 mmHg  Pulse 96  Temp(Src) 98.3 F (36.8 C) (Oral)  Resp 19  Ht  5\' 5"  (1.651 m)  Wt 210 lb (95.255 kg)  BMI 34.95 kg/m2  SpO2 90% Physical Exam  Constitutional: She is oriented to person, place, and time. She appears well-developed and well-nourished. No distress.  HENT:  Head: Normocephalic and atraumatic.  Eyes: Conjunctivae and EOM are normal. Pupils are equal, round, and reactive to light.  Neck: Normal range of motion. Neck supple.  Cardiovascular: Normal rate, regular rhythm and normal heart sounds.   Pulmonary/Chest:  Effort normal and breath sounds normal. She exhibits no tenderness.  Abdominal: Soft. Normal appearance. There is no tenderness.  Musculoskeletal: Normal range of motion.       Lumbar back: She exhibits tenderness and pain. She exhibits no deformity.  Neurological: She is alert and oriented to person, place, and time. She has normal reflexes. No cranial nerve deficit or sensory deficit. She exhibits normal muscle tone. Coordination normal. GCS eye subscore is 4. GCS verbal subscore is 5. GCS motor subscore is 6.  Skin: Skin is warm and dry. She is not diaphoretic.  Nursing note and vitals reviewed.   ED Course  Procedures (including critical care time) Labs Review Labs Reviewed  CBC - Abnormal; Notable for the following:    WBC 15.5 (*)    RBC 5.59 (*)    Hemoglobin 15.9 (*)    HCT 46.3 (*)    All other components within normal limits  BASIC METABOLIC PANEL    Imaging Review Dg Thoracic Spine 2 View  12/18/2014   CLINICAL DATA:  Slipped and fell, mid to low back pain  EXAM: THORACIC SPINE 2 VIEWS  COMPARISON:  None.  FINDINGS: There is no evidence of thoracic spine fracture. Alignment is normal. No other significant bone abnormalities are identified.  IMPRESSION: Negative.   Electronically Signed   By: Conchita Paris M.D.   On: 12/18/2014 13:14   Dg Lumbar Spine Complete  12/18/2014   CLINICAL DATA:  Slipped and fell, mid to low back pain, stiffness and limited range of motion  EXAM: LUMBAR SPINE - COMPLETE 4+ VIEW  COMPARISON:  None.  FINDINGS: 5 non rib-bearing lumbar type vertebral bodies are identified. Endplate degenerative change with minimal template compression deformity at inferior aspect T12 and superior aspect L1 noted. No bony retropulsion. Alignment is within normal limits. Minimal atheromatous aortic calcification. Probable gallstone partly visualized.  IMPRESSION: Endplate degenerative change with inferior T12 and superior L1 minimal endplate depression, of unknown  chronicity. No bony retropulsion.   Electronically Signed   By: Conchita Paris M.D.   On: 12/18/2014 13:26   I have personally reviewed and evaluated these images and lab results as part of my medical decision-making.   EKG Interpretation None      MDM   Final diagnoses:  None    Laura Kaufman presents with lower back pain following a slip and fall. No neurologic deficits or LOC.  Lumbar xray reveals T12 and L1 endplate deformity.  Able to ambulate a few feet before having what appeared to be a vagal response with lightheadedness, diaphoresis, nausea and pallor.  Symptoms resolved once movement, and therefore her pain, ceased.  After dilaudid administration, patient's pain was reduced to 0/10. When another patient ambulation was attempted, patient had what appeared to be another vagal response and a significant increase in pain. Patient was advised that she may have to be admitted overnight for pain management.  Lorayne Bender, PA-C 12/18/14 1602  Evelina Bucy, MD 12/18/14 [phone]

## 2014-12-18 NOTE — ED Notes (Signed)
Dr Mingo Amber in room and he gave verbal order to give pt oral benadryll

## 2014-12-18 NOTE — ED Notes (Signed)
Lumbar corsett at bedside. Rep has demonstrated use and instructions to patient.  Patient does not wear when laying in bed, only when up moving around.

## 2014-12-18 NOTE — ED Notes (Signed)
Patient returned from xray.

## 2014-12-18 NOTE — ED Notes (Signed)
Pt reports she slipped and fell in the kitchen this morning injuring her middle back.  Pt denies LOC or n/v.  Pt reports she landed on a kitchen rug which helped with the fall.  Pt is not on  Blood thinners.  No obvious deformity/injury.

## 2014-12-18 NOTE — ED Provider Notes (Signed)
Patient care transferred to me. CT scan shows compression fractures but no burst fractures or spinal cord impingement. Neurologically intact. Plan to place in a corset per Dr. Saintclair Halsted. Pain is not controlled and she is unable to get up and walk due to the severity of pain. Plan to admit to family practice for observation for better pain control.  Sherwood Gambler, MD 12/18/14 [phone]

## 2014-12-18 NOTE — ED Notes (Signed)
Pt requested benadryl for "stuffy nose" Pt states that she takes it every evening.

## 2014-12-18 NOTE — Progress Notes (Signed)
Pt. Arrived alert and oriented to unit. Oriented to room. Call bell at side. Bed alarm activated. Questions answered. Will continue to monitor. Bobbye Charleston, RN

## 2014-12-19 ENCOUNTER — Observation Stay (HOSPITAL_COMMUNITY): Payer: No Typology Code available for payment source

## 2014-12-19 DIAGNOSIS — W1839XA Other fall on same level, initial encounter: Secondary | ICD-10-CM | POA: Diagnosis not present

## 2014-12-19 DIAGNOSIS — M545 Low back pain, unspecified: Secondary | ICD-10-CM | POA: Insufficient documentation

## 2014-12-19 DIAGNOSIS — R338 Other retention of urine: Secondary | ICD-10-CM | POA: Diagnosis not present

## 2014-12-19 DIAGNOSIS — S32018A Other fracture of first lumbar vertebra, initial encounter for closed fracture: Secondary | ICD-10-CM | POA: Diagnosis not present

## 2014-12-19 DIAGNOSIS — W010XXA Fall on same level from slipping, tripping and stumbling without subsequent striking against object, initial encounter: Secondary | ICD-10-CM | POA: Insufficient documentation

## 2014-12-19 DIAGNOSIS — S32019A Unspecified fracture of first lumbar vertebra, initial encounter for closed fracture: Secondary | ICD-10-CM | POA: Diagnosis not present

## 2014-12-19 DIAGNOSIS — S32000A Wedge compression fracture of unspecified lumbar vertebra, initial encounter for closed fracture: Secondary | ICD-10-CM | POA: Insufficient documentation

## 2014-12-19 LAB — URINALYSIS, ROUTINE W REFLEX MICROSCOPIC
BILIRUBIN URINE: NEGATIVE
Glucose, UA: NEGATIVE mg/dL
KETONES UR: NEGATIVE mg/dL
Leukocytes, UA: NEGATIVE
NITRITE: NEGATIVE
Protein, ur: NEGATIVE mg/dL
Specific Gravity, Urine: 1.02 (ref 1.005–1.030)
UROBILINOGEN UA: 0.2 mg/dL (ref 0.0–1.0)
pH: 6 (ref 5.0–8.0)

## 2014-12-19 LAB — CBC
HCT: 43.6 % (ref 36.0–46.0)
Hemoglobin: 13.9 g/dL (ref 12.0–15.0)
MCH: 27 pg (ref 26.0–34.0)
MCHC: 31.9 g/dL (ref 30.0–36.0)
MCV: 84.7 fL (ref 78.0–100.0)
PLATELETS: 196 10*3/uL (ref 150–400)
RBC: 5.15 MIL/uL — AB (ref 3.87–5.11)
RDW: 13.1 % (ref 11.5–15.5)
WBC: 9.1 10*3/uL (ref 4.0–10.5)

## 2014-12-19 LAB — URINE MICROSCOPIC-ADD ON

## 2014-12-19 LAB — BASIC METABOLIC PANEL
ANION GAP: 8 (ref 5–15)
BUN: 8 mg/dL (ref 6–20)
CHLORIDE: 102 mmol/L (ref 101–111)
CO2: 31 mmol/L (ref 22–32)
Calcium: 9.5 mg/dL (ref 8.9–10.3)
Creatinine, Ser: 0.86 mg/dL (ref 0.44–1.00)
GFR calc non Af Amer: >60 mL/min (ref >60)
GLUCOSE: 125 mg/dL — AB (ref 65–99)
Potassium: 4.3 mmol/L (ref 3.5–5.1)
Sodium: 141 mmol/L (ref 135–145)

## 2014-12-19 MED ORDER — HYDROMORPHONE HCL 1 MG/ML IJ SOLN
0.5000 mg | INTRAMUSCULAR | Status: DC | PRN
  Administered 2014-12-19 – 2014-12-20 (×2): 0.5 mg via INTRAVENOUS
  Filled 2014-12-19 (×2): qty 1

## 2014-12-19 MED ORDER — HYDROMORPHONE HCL 1 MG/ML IJ SOLN
2.0000 mg | Freq: Once | INTRAMUSCULAR | Status: AC
  Administered 2014-12-19: 2 mg via INTRAVENOUS
  Filled 2014-12-19: qty 2

## 2014-12-19 MED ORDER — HYDROMORPHONE HCL 1 MG/ML IJ SOLN
1.0000 mg | INTRAMUSCULAR | Status: DC
  Administered 2014-12-19 – 2014-12-20 (×5): 1 mg via INTRAVENOUS
  Filled 2014-12-19 (×5): qty 1

## 2014-12-19 MED ORDER — CALCITONIN (SALMON) 200 UNIT/ACT NA SOLN
1.0000 | Freq: Every day | NASAL | Status: DC
  Administered 2014-12-19 – 2014-12-21 (×3): 1 via NASAL
  Filled 2014-12-19: qty 3.7

## 2014-12-19 NOTE — Evaluation (Signed)
Physical Therapy Evaluation Patient Details Name: Laura Kaufman MRN: [id number] DOB: December 01, 1955 Today's Date: 12/19/2014   History of Present Illness  pt presents after falling and sustaining T12 and L1 fxs with hx of knee surgery.    Clinical Impression  Pt will need continued education for back safety and safety with mobility.  Pt able to ambulate today, but did have some nausea.  Will continue to follow while on acute.      Follow Up Recommendations Home health PT;Supervision/Assistance - 24 hour    Equipment Recommendations  None recommended by PT    Recommendations for Other Services       Precautions / Restrictions Precautions Precautions: Fall;Back Precaution Booklet Issued: No Precaution Comments: Reviewed back precautions. Required Braces or Orthoses: Spinal Brace Spinal Brace: Lumbar corset;Applied in sitting position Restrictions Weight Bearing Restrictions: No      Mobility  Bed Mobility Overal bed mobility: Needs Assistance Bed Mobility: Rolling;Sidelying to Sit Rolling: Supervision Sidelying to sit: Supervision       General bed mobility comments: cues for log roll technique, but no physical A needed.  pt nauseated upon sitting, but nausea passed while sitting a few minutes.    Transfers Overall transfer level: Needs assistance Equipment used: Rolling walker (2 wheeled) Transfers: Sit to/from Stand Sit to Stand: Min assist         General transfer comment: cues for UE use and controlling descent to sitting.    Ambulation/Gait Ambulation/Gait assistance: Min guard Ambulation Distance (Feet): 80 Feet Assistive device: Rolling walker (2 wheeled) Gait Pattern/deviations: Step-through pattern;Decreased stride length;Shuffle     General Gait Details: cues for upright posture and safe use of RW.  pt with heavy reliance on UEs despite cues to relax UEs on RW.    Stairs            Wheelchair Mobility    Modified Rankin (Stroke Patients  Only)       Balance Overall balance assessment: Needs assistance Sitting-balance support: Single extremity supported;Feet supported Sitting balance-Leahy Scale: Fair Sitting balance - Comments: pt needs at least a single UE to maintain balance.     Standing balance support: Bilateral upper extremity supported;During functional activity Standing balance-Leahy Scale: Poor                               Pertinent Vitals/Pain Pain Assessment: 0-10 Pain Score: 1  Pain Location: Back Pain Descriptors / Indicators: Sore Pain Intervention(s): Monitored during session;Premedicated before session;Repositioned    Home Living Family/patient expects to be discharged to:: Private residence Living Arrangements: Spouse/significant other Available Help at Discharge: Family;Available 24 hours/day Type of Home: House Home Access: Stairs to enter   [employer] of Steps: "couple" Home Layout: One level Home Equipment: Environmental consultant - 2 wheels;Walker - 4 wheels;Cane - single point;Bedside commode;Shower seat      Prior Function Level of Independence: Independent               Hand Dominance        Extremity/Trunk Assessment   Upper Extremity Assessment: Overall WFL for tasks assessed           Lower Extremity Assessment: Overall WFL for tasks assessed      Cervical / Trunk Assessment: Normal  Communication   Communication: No difficulties  Cognition Arousal/Alertness: Awake/alert Behavior During Therapy: WFL for tasks assessed/performed Overall Cognitive Status: Within Functional Limits for tasks assessed  General Comments      Exercises        Assessment/Plan    PT Assessment Patient needs continued PT services  PT Diagnosis Difficulty walking   PT Problem List Decreased activity tolerance;Decreased balance;Decreased mobility;Decreased knowledge of use of DME;Decreased knowledge of precautions;Pain  PT Treatment  Interventions DME instruction;Gait training;Stair training;Functional mobility training;Therapeutic activities;Therapeutic exercise;Balance training;Patient/family education   PT Goals (Current goals can be found in the Care Plan section) Acute Rehab PT Goals Patient Stated Goal: Back to normal PT Goal Formulation: With patient Time For Goal Achievement: 12/26/14 Potential to Achieve Goals: Good    Frequency Min 5X/week   Barriers to discharge        Co-evaluation               End of Session Equipment Utilized During Treatment: Gait belt;Back brace Activity Tolerance: Patient tolerated treatment well Patient left: in chair;with call bell/phone within reach;with chair alarm set Nurse Communication: Mobility status    Functional Assessment Tool Used: Clinical Judgement Functional Limitation: Mobility: Walking and moving around Mobility: Walking and Moving Around Current Status (L3810): At least 1 percent but less than 20 percent impaired, limited or restricted Mobility: Walking and Moving Around Goal Status [phone]): 0 percent impaired, limited or restricted    Time: 1358-1440 PT Time Calculation (min) (ACUTE ONLY): 42 min   Charges:   PT Evaluation $Initial PT Evaluation Tier I: 1 Procedure PT Treatments $Gait Training: 8-22 mins $Therapeutic Activity: 8-22 mins   PT G Codes:   PT G-Codes **NOT FOR INPATIENT CLASS** Functional Assessment Tool Used: Clinical Judgement Functional Limitation: Mobility: Walking and moving around Mobility: Walking and Moving Around Current Status (C5852): At least 1 percent but less than 20 percent impaired, limited or restricted Mobility: Walking and Moving Around Goal Status [phone]): 0 percent impaired, limited or restricted    Catarina Hartshorn, [place] 12/19/2014, 3:13 PM

## 2014-12-19 NOTE — Care Management Note (Signed)
Case Management Note  Patient Details  Name: Laura Kaufman MRN: [id number] Date of Birth: 01/04/1956  Subjective/Objective:                    Action/Plan: Patient admitted with a fall at home and sustained a L1 vertebral fracture. Pt lives at home with her spouse. Await PT recommendations for discharge disposition. CM will continue to follow for discharge needs.  Expected Discharge Date:                  Expected Discharge Plan:  Home/Self Care  In-House Referral:     Discharge planning Services     Post Acute Care Choice:    Choice offered to:     DME Arranged:    DME Agency:     HH Arranged:    HH Agency:     Status of Service:  In process, will continue to follow  Medicare Important Message Given:    Date Medicare IM Given:    Medicare IM give by:    Date Additional Medicare IM Given:    Additional Medicare Important Message give by:     If discussed at [place] of Stay Meetings, dates discussed:    Additional Comments:  Pollie Friar, RN 12/19/2014, 10:51 AM

## 2014-12-19 NOTE — Consult Note (Signed)
Reason for Consult: T12-L1 compression fractures Referring Physician: Emergency department tried hospitalist  Laura Kaufman is an 59 y.o. female.  HPI: Patient is a 59 year old female who slipped and fell yesterday over some cat urine. Experience immediate back pain and denied any numbness tingling or pain into her legs skin the ER was evaluated noted to have mild endplate compression for fractures with minimal kyphosis and minimal deformity. 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 Currently the patient denies any numbness tingling any pain or legs any saddle anesthesia. The patient was noted to have some urinary retention. She denies any lower excision be symptoms in her back pain is much better.  History reviewed. No pertinent past medical history.  Past Surgical History  Procedure Laterality Date  . Abdominal hysterectomy    . Knee surgery    . Wrist surgery      History reviewed. No pertinent family history.  Social History:  reports that she has never smoked. She does not have any smokeless tobacco history on file. She reports that she drinks alcohol. She reports that she does not use illicit drugs.  Allergies:  Allergies  Allergen Reactions  . Bee Venom Anaphylaxis  . Penicillins Anaphylaxis and Shortness Of Breath    Has patient had a PCN reaction causing immediate rash, facial/tongue/throat swelling, SOB or lightheadedness with hypotension: Noyes Has patient had a PCN reaction causing severe rash involving mucus membranes or skin necrosis: Noyes Has patient had a PCN reaction that required hospitalization Noyes Has patient had a PCN reaction occurring within the last 10 years: Nono If all of the above answers are "NO", then may proceed with Cephalospor    Medications: I have reviewed the patient's current medications.  Results for orders placed or performed during the hospital encounter of 12/18/14 (from  the past 48 hour(s))  CBC     Status: Abnormal   Collection Time: 12/18/14  3:35 PM  Result Value Ref Range   WBC 15.5 (H) 4.0 - 10.5 K/uL   RBC 5.59 (H) 3.87 - 5.11 MIL/uL   Hemoglobin 15.9 (H) 12.0 - 15.0 g/dL   HCT 46.3 (H) 36.0 - 46.0 %   MCV 82.8 78.0 - 100.0 fL   MCH 28.4 26.0 - 34.0 pg   MCHC 34.3 30.0 - 36.0 g/dL   RDW 12.7 11.5 - 15.5 %   Platelets 205 150 - 400 K/uL  Basic metabolic panel     Status: Abnormal   Collection Time: 12/18/14  3:35 PM  Result Value Ref Range   Sodium 139 135 - 145 mmol/L   Potassium 4.0 3.5 - 5.1 mmol/L   Chloride 104 101 - 111 mmol/L   CO2 25 22 - 32 mmol/L   Glucose, Bld 140 (H) 65 - 99 mg/dL   BUN 7 6 - 20 mg/dL   Creatinine, Ser 0.83 0.44 - 1.00 mg/dL   Calcium 9.7 8.9 - 10.3 mg/dL   GFR calc non Af Amer >60 >60 mL/min   GFR calc Af Amer >60 >60 mL/min    Comment: (NOTE) The eGFR has been calculated using the CKD EPI equation. This calculation has not been validated in all clinical situations. eGFR's persistently <60 mL/min signify possible Chronic Kidney Disease.    Anion gap 10 5 - 15  Basic metabolic panel     Status: Abnormal   Collection Time: 12/19/14  4:39 AM  Result Value Ref Range   Sodium 141 135 -  145 mmol/L   Potassium 4.3 3.5 - 5.1 mmol/L   Chloride 102 101 - 111 mmol/L   CO2 31 22 - 32 mmol/L   Glucose, Bld 125 (H) 65 - 99 mg/dL   BUN 8 6 - 20 mg/dL   Creatinine, Ser 0.86 0.44 - 1.00 mg/dL   Calcium 9.5 8.9 - 10.3 mg/dL   GFR calc non Af Amer >60 >60 mL/min   GFR calc Af Amer >60 >60 mL/min    Comment: (NOTE) The eGFR has been calculated using the CKD EPI equation. This calculation has not been validated in all clinical situations. eGFR's persistently <60 mL/min signify possible Chronic Kidney Disease.    Anion gap 8 5 - 15  CBC     Status: Abnormal   Collection Time: 12/19/14  4:39 AM  Result Value Ref Range   WBC 9.1 4.0 - 10.5 K/uL   RBC 5.15 (H) 3.87 - 5.11 MIL/uL   Hemoglobin 13.9 12.0 - 15.0 g/dL    HCT 43.6 36.0 - 46.0 %   MCV 84.7 78.0 - 100.0 fL   MCH 27.0 26.0 - 34.0 pg   MCHC 31.9 30.0 - 36.0 g/dL   RDW 13.1 11.5 - 15.5 %   Platelets 196 150 - 400 K/uL    Dg Thoracic Spine 2 View  12/18/2014   CLINICAL DATA:  Slipped and fell, mid to low back pain  EXAM: THORACIC SPINE 2 VIEWS  COMPARISON:  None.  FINDINGS: There is no evidence of thoracic spine fracture. Alignment is normal. No other significant bone abnormalities are identified.  IMPRESSION: Negative.   Electronically Signed   By: Conchita Paris M.D.   On: 12/18/2014 13:14   Dg Lumbar Spine Complete  12/18/2014   CLINICAL DATA:  Slipped and fell, mid to low back pain, stiffness and limited range of motion  EXAM: LUMBAR SPINE - COMPLETE 4+ VIEW  COMPARISON:  None.  FINDINGS: 5 non rib-bearing lumbar type vertebral bodies are identified. Endplate degenerative change with minimal template compression deformity at inferior aspect T12 and superior aspect L1 noted. No bony retropulsion. Alignment is within normal limits. Minimal atheromatous aortic calcification. Probable gallstone partly visualized.  IMPRESSION: Endplate degenerative change with inferior T12 and superior L1 minimal endplate depression, of unknown chronicity. No bony retropulsion.   Electronically Signed   By: Conchita Paris M.D.   On: 12/18/2014 13:26   Ct Lumbar Spine Wo Contrast  12/18/2014   CLINICAL DATA:  [place] this morning, had to crawl through house to get a phone, T12-L1 fracture  EXAM: CT LUMBAR SPINE WITHOUT CONTRAST  TECHNIQUE: Multidetector CT imaging of the lumbar spine was performed without intravenous contrast administration. Multiplanar CT image reconstructions were also generated.  COMPARISON:  Radiographs 12/18/2014  FINDINGS: Five non rib-bearing lumbar type vertebra by prior radiographs.  Minimal atherosclerotic calcifications.  Visualized retroperitoneal structures otherwise unremarkable.  Bones appear demineralized.  Anterior compression fracture T12  involving the inferior endplate with additional mild superior endplate concavity/compression deformity, resulting in mild anterior height loss.  No retropulsion of fragments.  Mild anterior height loss L1 vertebral body due to age indeterminate superior endplate deformity.  Remaining lumbar vertebra demonstrate normal height and alignment.  No subluxation or bone destruction.  BILATERAL spondylolysis L5 without spondylolisthesis.  Minimally bulging discs inferior lumbar spine without neural compression or definite focal disc herniation  IMPRESSION: Acute anterior compression fracture of T12 vertebral body with minimal anterior height loss.  Age-indeterminate superior endplate anterior compression deformity of L1.  BILATERAL spondylolysis L5 without spondylolisthesis.   Electronically Signed   By: Lavonia Dana M.D.   On: 12/18/2014 16:55    Review of Systems  Constitutional: Negative.   HENT: Negative.   Eyes: Negative.   Respiratory: Negative.   Cardiovascular: Negative.   Gastrointestinal: Negative.   Musculoskeletal: Positive for back pain.  Neurological: Negative.   Psychiatric/Behavioral: Negative.    Blood pressure 155/106, pulse 104, temperature 98.8 F (37.1 C), temperature source Axillary, resp. rate 16, height 5' 8"  (1.727 m), weight 93.169 kg (205 lb 6.4 oz), SpO2 97 %. Physical Exam  Neurological: She has normal strength. GCS eye subscore is 4. GCS verbal subscore is 5. GCS motor subscore is 6.  Patient is awake and alert strength is 5 out of 5 in her iliopsoas, quads, hamstrings, gastrocs, and tibialis, and EHL. Sensation is grossly intact to light touch.    Assessment/Plan: Patient was admitted to the medicine service will be mobilized in her brace. I do not believe her urinary retention is neurogenic in nature. Her compression fractures are a mild endplate fractures with no posterior vertebral body involvement nothing in the canal near the nerves. I feel this is more opiod and pain  related. Recommended to medicine service checking a UA look her pain medication if there is still some question later on they could check an MRI of her lumbar spine but I do not think that's needed. Mobilized in the brace check an upright film and the blade brace and may discharged when she is cleared medically.  CRAM,GARY P 12/19/2014, 9:26 AM

## 2014-12-19 NOTE — Progress Notes (Signed)
Patient voided 350ml and was bladder scanned after. Bladder scan shows 63ml.

## 2014-12-19 NOTE — Progress Notes (Signed)
Family Medicine Teaching Service Daily Progress Note Intern Pager: [phone]  Patient name: Laura Kaufman Medical record number: [id number] Date of birth: [birth year] Age: 59 y.o. Gender: female  Primary Care Provider: No PCP Per Patient Consultants: Neurosurgery Code Status: FULL  Pt Overview and Major Events to Date:  9/29: Admitted to the FPTS for pain management.  Assessment and Plan: Laura Kaufman is a 59 y.o. female presenting with back pain s/p fall. PMH is significant for allergic rhinitis, s/p patella replacement, and right peritonsillar abscess.   T12 & L1 Compression Fractures: Back pain associated with recent fall. Lumbar spine Xray showed endplate degenerative change with inferior T12 and superior L1 minimal endplate depression, no bony retropulsion. Xray of thoracic spine was negative. CT lumbar spine: Acute anterior compression fracture of T12 vertebral body with minimal anterior height loss, age-indeterminate superior endplate anterior compression deformity of L1, and bilateral spondylolysis L5 without spondylolisthesis. Neurosurgery recommended lumbar corset. Pain is somewhat uncontrolled this morning. - Neurosurgery saw her, appreciate recommendations. Would like to obtain an xray with her sitting and wearing the brace to make sure her spine is aligned with the brace. - Pain control: Dilaudid 0.5mg  q4h, Dilaudid 1mg  q3h for breakthrough pain and Naproxen 500 mg BID. Will give 1mg  Dilaudid now and reassess pain - Heating pad to back prn  - Continue with lumbar corset - Miralax prn for constipation 2/2 to pain medication  - PT consult   Urinary Retention: Pt unable to void since yesterday morning. Bladder scan showing > 999 cc's urine. I&O cath x 1 overnight. No saddle anesthesia, normal strength and sensation in the lower extremities.  - Will see if she can urinate spontaneously this morning. If not, will bladder scan and potentially place a Foley. - Per Neuro, this is not  likely related to her compression fractures. More likely caused by medications or pain.  Elevated Blood Pressure: Patient does not have a diagnosis of HTN. Had two elevated pressures in the ED with systolics in the 638G reportedly when patient was having IV placed. Likely elevated 2/2 to pain. Suspect will improve with adequate pain control. Blood pressures ranged from 124-134/71 overnight. - Will continue to monitor  Leukocytosis: Resolved. WBC 15.5 -> 9.1. Patient is afebrile and not exhibiting any signs of infection. RBC, HgB and Hct elevated as well so suspect related to hemoconcentration 2/2 to dehydration.  - Received NS 576mL bolus  - Encouraged good PO intake  Allergic Rhinitis - Continue home Benadryl   FEN/GI: Regular diet, Saline Lock IV PPx: Lovenox 40mg  qd  Disposition: Discharge pending improvement in pain.  Subjective:  Had some urinary retention overnight. She was bladder scanned, which showed >999cc's of urine. I&O cath x 1. States she is in 8/10 pain this morning. The pain medications are helping to take the edge off.   Objective: Temp:  [97.9 F (36.6 C)-98.6 F (37 C)] 98 F (36.7 C) (09/30 0533) Pulse Rate:  [87-109] 97 (09/30 0533) Resp:  [12-23] 16 (09/30 0533) BP: (120-162)/(65-102) 134/71 mmHg (09/30 0533) SpO2:  [90 %-100 %] 95 % (09/30 0533) Weight:  [205 lb 6.4 oz (93.169 kg)-210 lb (95.255 kg)] 205 lb 6.4 oz (93.169 kg) (09/29 2107) Physical Exam: General: WDWN female lying in bed in NAD  Eyes: EOMI. MMM.  ENTM: Oropharynx clear.  Neck: Full ROM.  Cardiovascular: RRR. No murmurs appreciated.  Respiratory: CTAB in anterior lung fields, no wheezes Abdomen: soft, NTND  MSK: Moves all 4 extremities.  Skin: Warm and dry  Neuro: Alert and oriented. No gross deficits. Strength 5/5 in UE and LE bilaterally. Normal sensation. Psych: Appropriate mood and affect  Laboratory:  Recent Labs Lab 12/18/14 1535 12/19/14 0439  WBC 15.5* 9.1  HGB  15.9* 13.9  HCT 46.3* 43.6  PLT 205 196    Recent Labs Lab 12/18/14 1535 12/19/14 0439  NA 139 141  K 4.0 4.3  CL 104 102  CO2 25 31  BUN 7 8  CREATININE 0.83 0.86  CALCIUM 9.7 9.5  GLUCOSE 140* 125*     Imaging/Diagnostic Tests: -Xray lumbar spine (9/29): Endplate degenerative change with inferior T12 and superior L1 minimal endplate depression of unknown chronicity. No bony retropulsion. -Xray thoracic spine (9/29): No evidence of thoracic spine fracture. -CT lumbar spine (9/29): Acute anterior compressure fracture T12 vertebral body with minimal anterior height loss. Age-indeterminate superior endplate anterior compression deformity of L1.   Sela Hua, MD 12/19/2014, 8:15 AM PGY-1, [place] Intern pager: [phone], text pages welcome

## 2014-12-19 NOTE — Progress Notes (Signed)
Pt states that she has not voided since yesterday at 10am. Has not voided during PM shift. Bladder scan volume of >965mls. MD paged. In and out cath ordered. Just before cathed, pt was able to void 167mls.  In and out cath volume of 815mls collected. Will continue to monitor.

## 2014-12-20 DIAGNOSIS — S32000A Wedge compression fracture of unspecified lumbar vertebra, initial encounter for closed fracture: Secondary | ICD-10-CM

## 2014-12-20 DIAGNOSIS — R338 Other retention of urine: Secondary | ICD-10-CM | POA: Diagnosis not present

## 2014-12-20 MED ORDER — ONDANSETRON HCL 4 MG PO TABS
8.0000 mg | ORAL_TABLET | Freq: Three times a day (TID) | ORAL | Status: DC | PRN
  Administered 2014-12-20: 8 mg via ORAL
  Filled 2014-12-20: qty 2

## 2014-12-20 MED ORDER — MORPHINE SULFATE ER 15 MG PO TBCR
15.0000 mg | EXTENDED_RELEASE_TABLET | Freq: Two times a day (BID) | ORAL | Status: DC
  Administered 2014-12-20 – 2014-12-21 (×3): 15 mg via ORAL
  Filled 2014-12-20 (×3): qty 1

## 2014-12-20 MED ORDER — MORPHINE SULFATE 15 MG PO TABS
15.0000 mg | ORAL_TABLET | Freq: Four times a day (QID) | ORAL | Status: DC | PRN
  Administered 2014-12-20: 15 mg via ORAL
  Filled 2014-12-20: qty 1

## 2014-12-20 MED ORDER — POLYETHYLENE GLYCOL 3350 17 G PO PACK
17.0000 g | PACK | Freq: Two times a day (BID) | ORAL | Status: DC
  Administered 2014-12-20 – 2014-12-21 (×2): 17 g via ORAL
  Filled 2014-12-20 (×2): qty 1

## 2014-12-20 MED ORDER — DOCUSATE SODIUM 100 MG PO CAPS
100.0000 mg | ORAL_CAPSULE | Freq: Two times a day (BID) | ORAL | Status: DC
  Administered 2014-12-20 – 2014-12-21 (×3): 100 mg via ORAL
  Filled 2014-12-20 (×3): qty 1

## 2014-12-20 NOTE — Progress Notes (Signed)
Physical Therapy Treatment Patient Details Name: Laura Kaufman MRN: [id number] DOB: [birth year] Today's Date: 12/20/2014    History of Present Illness pt presents after falling and sustaining T12 and L1 fxs with hx of knee surgery.      PT Comments    Patient had just gotten back from walking with nursing student but agreeable to walk again and practice steps. Patient is planning to DC home later today. Did well with stair training. Patient safe to D/C from a mobility standpoint based on progression towards goals set on PT eval.    Follow Up Recommendations  Home health PT;Supervision/Assistance - 24 hour     Equipment Recommendations  None recommended by PT    Recommendations for Other Services       Precautions / Restrictions Precautions Precautions: Fall;Back Required Braces or Orthoses: Spinal Brace Spinal Brace: Lumbar corset;Applied in sitting position Restrictions Weight Bearing Restrictions: No    Mobility  Bed Mobility     Rolling: Supervision         General bed mobility comments: cues for log roll technique, but no physical A needed  Transfers Overall transfer level: Needs assistance Equipment used: Rolling walker (2 wheeled)   Sit to Stand: Supervision         General transfer comment: Supervision for safety. Patient with safe technique  Ambulation/Gait Ambulation/Gait assistance: Supervision Ambulation Distance (Feet): 200 Feet Assistive device: Rolling walker (2 wheeled) Gait Pattern/deviations: Step-through pattern;Decreased stride length Gait velocity: guarded   General Gait Details: cues for upright posture and safe use of RW.  pt with heavy reliance on UEs despite cues to relax UEs on RW.     Stairs Stairs: Yes Stairs assistance: Min guard Stair Management: Step to pattern;Sideways;One rail Left;One rail Right Number of Stairs: 3 General stair comments: Cues for sequency and technique  Wheelchair Mobility    Modified Rankin  (Stroke Patients Only)       Balance                                    Cognition Arousal/Alertness: Awake/alert Behavior During Therapy: WFL for tasks assessed/performed Overall Cognitive Status: Within Functional Limits for tasks assessed                      Exercises      General Comments        Pertinent Vitals/Pain Pain Score: 1  Pain Location: back Pain Descriptors / Indicators: Sore Pain Intervention(s): Monitored during session    Home Living                      Prior Function            PT Goals (current goals can now be found in the care plan section) Progress towards PT goals: Progressing toward goals    Frequency  Min 5X/week    PT Plan Current plan remains appropriate    Co-evaluation             End of Session Equipment Utilized During Treatment: Back brace Activity Tolerance: Patient tolerated treatment well Patient left: in chair;with call bell/phone within reach;with chair alarm set     Time: 6269-4854 PT Time Calculation (min) (ACUTE ONLY): 19 min  Charges:  $Gait Training: 8-22 mins                    G Codes:  Jacqualyn Posey 12/20/2014, 9:21 AM 12/20/2014 Jacqualyn Posey PTA

## 2014-12-20 NOTE — Progress Notes (Signed)
Patient reluctant to have soap suds enema. She is increasing her fluids and ambulation and has another dose of miralax this evening. Will leave enema on medication list in case patient changes her mind.

## 2014-12-20 NOTE — Progress Notes (Signed)
MD asked RN if pt had her soap suds enema and said the pt needed it for constipation.  RN asked pt if she would take the enema, and pt stated "I feel like I can go and I normally have a bowel movement in the morning".  Pt wants to try and drink decaf coffee and to walk again to see if she can have a bowel movement before taking the enema.  Will continue to monitor.    Fredrich Romans, RN

## 2014-12-20 NOTE — Progress Notes (Signed)
Pt drank decaf coffee, prune juice, miralax, stool softener, and has walked multiple times tonight.  Pt was educated about the soap suds enema and its effects and she has still refused to take it.  She wants to see if she is able to have a bowel movement in the morning like she normally does and if she is unable to, she said she would try the enema.  Will continue to monitor.   Fredrich Romans, RN

## 2014-12-20 NOTE — Progress Notes (Signed)
Family Medicine Teaching Service Daily Progress Note Intern Pager: [phone]  Patient name: Laura Kaufman Medical record number: [id number] Date of birth: [birth year] Age: 59 y.o. Gender: female  Primary Care Provider: No PCP Per Patient Consultants: Neurosurgery Code Status: FULL  Pt Overview and Major Events to Date:  9/29: Admitted to the FPTS for pain management. 10/1: Transition to PO pain meds (appreciate recs from pharmacy)   Assessment and Plan: Laura Kaufman is a 59 y.o. female presenting with back pain s/p fall. PMH is significant for allergic rhinitis, s/p patella replacement, and right peritonsillar abscess.   T12 & L1 Compression Fractures: Back pain associated with recent fall. Lumbar spine Xray showed endplate degenerative change with inferior T12 and superior L1 minimal endplate depression, no bony retropulsion. Xray of thoracic spine was negative. CT lumbar spine: Acute anterior compression fracture of T12 vertebral body with minimal anterior height loss, age-indeterminate superior endplate anterior compression deformity of L1, and bilateral spondylolysis L5 without spondylolisthesis. Neurosurgery recommended lumbar corset. X-ray obtained while wearing brace and sitting: normal alignment. Pain is controlled this morning. - Neurosurgery saw her, appreciate recommendations: recommend UA; may be discharged when she is cleared medically - Pain control: Dilaudid 1 mg q4h, Dilaudid 0.5 mg q3h for breakthrough pain and Naproxen 500 mg BID.; consider transitioning to PO: per pharmacy can start with MSContin 15mg  BID with IR Morphine 15mg  q 6 hr PRN   - Heating pad to back prn  - Continue with lumbar corset - Miralax prn for constipation 2/2 to pain medication  - PT consult: HH PT with 24hr supervision/assistance (ordered)   Vomiting: New this morning. Patient has had intermittent nausea during hospital stay but had emesis for the first time today. Notes of some abdominal pain that  has improved with voiding. Has not had a BM since 9/29. Has been eating.  - will monitor - Zofran 8mg  q 8 hrs PRN    Constipation: No BM since 9/29. Has been on Dilaudid with PRN miralax that she received once.  - schedule Miralax and Colace  Urinary Retention: Pt unable to void since yesterday morning. Bladder scan showing > 999 cc's urine. I&O cath x 1 overnight. No saddle anesthesia, normal strength and sensation in the lower extremities.  Per Neuro, this is not likely related to her compression fractures. More likely caused by medications or pain. Voided spontaneously 336ml with residual 10ml 9/30 afternoon. Also voided 69ml this AM.  - UA: trace hgb otherwise; rare bact, 3-6 wbc  - Resolved   Elevated Blood Pressure: Patient does not have a diagnosis of HTN. Had two elevated pressures in the ED with systolics in the 831D reportedly when patient was having IV placed. Likely elevated 2/2 to pain. Suspect will improve with adequate pain control. Blood pressures ranged from 135-140/52-76 overnight. - Will continue to monitor  Leukocytosis: Resolved. WBC 15.5 -> 9.1. Patient is afebrile and not exhibiting any signs of infection. RBC, HgB and Hct elevated as well so suspect related to hemoconcentration 2/2 to dehydration.  - Received NS 538mL bolus  - Encouraged good PO intake  Allergic Rhinitis - Continue home Benadryl   FEN/GI: Regular diet, Saline Lock IV PPx: Lovenox 40mg  qd  Disposition: Discharge controlled pain on PO pain meds and resolution of constipation/vomiting.  Subjective:  Patient states she is doing fine. States pain is 3/10 usually, but increased with working with PT this morning. Dilaudid 1mg  q4 hrs scheduled with 0.5mg  q 3 PRN x 2 overnight. Patient started to vomit  during visit. States she has been having nausea intermittently but this is the first time she vomited. Patient has not had a bowel movement since 9/29; states she does feel bloated. States she usually has  BM everyday. Notes of abdominal pain but states it is better since she was voided.  Objective: Temp:  [98 F (36.7 C)-98.8 F (37.1 C)] 98.4 F (36.9 C) (10/01 0554) Pulse Rate:  [88-104] 95 (10/01 0554) Resp:  [16-18] 18 (10/01 0554) BP: (129-155)/(62-106) 135/65 mmHg (10/01 0554) SpO2:  [93 %-98 %] 93 % (10/01 0554) Physical Exam: General: NAD sitting in chair. Started vomiting  Cardiovascular: RRR, no m/r/g Respiratory: CTAB Abdomen: soft NT, ND, hypoactive bowel sounds  Extremities: moves all 4 extremities   Laboratory:  Recent Labs Lab 12/18/14 1535 12/19/14 0439  WBC 15.5* 9.1  HGB 15.9* 13.9  HCT 46.3* 43.6  PLT 205 196    Recent Labs Lab 12/18/14 1535 12/19/14 0439  NA 139 141  K 4.0 4.3  CL 104 102  CO2 25 31  BUN 7 8  CREATININE 0.83 0.86  CALCIUM 9.7 9.5  GLUCOSE 140* 125*   Imaging/Diagnostic Tests: -Xray lumbar spine (9/29): Endplate degenerative change with inferior T12 and superior L1 minimal endplate depression of unknown chronicity. No bony retropulsion. -Xray thoracic spine (9/29): No evidence of thoracic spine fracture. -CT lumbar spine (9/29): Acute anterior compressure fracture T12 vertebral body with minimal anterior height loss. Age-indeterminate superior endplate anterior compression deformity of L1.  Smiley Houseman, MD 12/20/2014, 8:44 AM PGY-1, [place] Intern pager: [phone], text pages welcome

## 2014-12-20 NOTE — Progress Notes (Signed)
Patient ID: Laura Kaufman, female   DOB: May 15, 1955, 59 y.o.   MRN: [id number] Flat in bed. No neuro symptoms. Pt to see

## 2014-12-21 DIAGNOSIS — R338 Other retention of urine: Secondary | ICD-10-CM | POA: Diagnosis not present

## 2014-12-21 DIAGNOSIS — S32018A Other fracture of first lumbar vertebra, initial encounter for closed fracture: Principal | ICD-10-CM

## 2014-12-21 DIAGNOSIS — W1839XA Other fall on same level, initial encounter: Secondary | ICD-10-CM | POA: Diagnosis not present

## 2014-12-21 DIAGNOSIS — M545 Low back pain: Secondary | ICD-10-CM | POA: Diagnosis not present

## 2014-12-21 MED ORDER — CALCIUM CARBONATE-VITAMIN D 500-200 MG-UNIT PO TABS: 2.0000 | ORAL_TABLET | Freq: Every day | ORAL | Status: DC

## 2014-12-21 MED ORDER — MORPHINE SULFATE ER 15 MG PO TBCR: 15.0000 mg | EXTENDED_RELEASE_TABLET | Freq: Two times a day (BID) | ORAL | Status: DC

## 2014-12-21 MED ORDER — POLYETHYLENE GLYCOL 3350 17 G PO PACK
17.0000 g | PACK | Freq: Two times a day (BID) | ORAL | Status: DC
Start: 2014-12-21 — End: 2015-05-14

## 2014-12-21 MED ORDER — CALCITONIN (SALMON) 200 UNIT/ACT NA SOLN: 1.0000 | Freq: Every day | NASAL | Status: DC

## 2014-12-21 MED ORDER — MORPHINE SULFATE 15 MG PO TABS: 15.0000 mg | ORAL_TABLET | Freq: Four times a day (QID) | ORAL | Status: DC | PRN

## 2014-12-21 NOTE — Progress Notes (Signed)
DC instructions, prescriptions and handouts given to patient. All questions answered. Reviewed back precautions and use of brace. Patient in [facility] to be escorted to lobby by staff. Patient's SO to transport home in private vehicle.

## 2014-12-21 NOTE — Progress Notes (Signed)
Overall stable. No new issues or problems. Pain control still difficult. Patient beginning to mobilize. Examination stable. Remains neurologically intact. Continue current management. No new recommendations.

## 2014-12-21 NOTE — Care Management Note (Signed)
Case Management Note  Patient Details  Name: Laura Kaufman MRN: [id number] Date of Birth: Oct 04, 1955  Subjective/Objective:                   Fall Action/Plan: Discharge planning  Expected Discharge Date:  12/21/14              Expected Discharge Plan:  [place]  In-House Referral:     Discharge planning Services  CM Consult  Post Acute Care Choice:  Home Health Choice offered to:  Patient  DME Arranged:  N/A DME Agency:     HH Arranged:  PT [place]:  [place]  Status of Service:  Completed, signed off  Medicare Important Message Given:    Date Medicare IM Given:    Medicare IM give by:    Date Additional Medicare IM Given:    Additional Medicare Important Message give by:     If discussed at [place] of Stay Meetings, dates discussed:    Additional Comments: CM met with pt in roomto offer choice of home health agency.  Pt chooses [facility] to render HHPT.  Pt states Vance Gather has accepted her as her PCP.  Address and contact information verified by pt.  Referral called to [facility] rep, Tiffany with PCP.  No other CM needs were communicated. Dellie Catholic, RN 12/21/2014, 12:16 PM

## 2014-12-21 NOTE — Discharge Summary (Signed)
Laura Kaufman  Patient name: Laura Kaufman Medical record number: [id number] Date of birth: 12-Mar-1956 Age: 59 y.o. Gender: female Date of Admission: 12/18/2014  Date of Discharge: 12/21/2014  Admitting Physician: Alveda Reasons, MD  Primary Care Provider: No PCP Per Patient Consultants: Neurosurgery  Indication for Hospitalization: Acute traumatic vertebral compression fracture  Discharge Diagnoses/Problem List:  Traumatic anterior compression fracture T12, L1 Fall from slip Acute urinary retention (resolved)  Disposition: Discharge home  Discharge Condition: Stable  Discharge Exam:  General: Well-appearing 59yo female in no distress CV: Reg rate, no murmur, distal pulses 2+ Pulm: Nonlabored, CTAB GI: soft NT, ND, hypoactive bowel sounds  Neuro: Alert, oriented, no deficits in strength or sensation bilateral LEs.   Brief Hospital Course:  Laura Kaufman is a 59 y.o. female presenting with back pain after fall from standing height from slipping on cat urine in her kitchen 9/29. Lumbar spine Xray showed endplate degenerative change, inferior T12, and superior L1 endplate depression, thoracic spine was negative. On CT, an acute anterior compression fracture of T12 vertebral body with minimal anterior height loss, age-indeterminate superior endplate anterior compression deformity of L1, and bilateral L5 spondylolysis without spondylolisthesis was noted. No evidence of spinal cord compression was noted. Neurosurgery recommended lumbar corset. She developed urinary retention thought to be due to pain and analgesics, not spinal cord compression, which resolved spontaneously. Pain continued to be controlled with PO medications. Bowel regimen was continued with some constipation which resolved prior to discharge. Repeat lumbar XR with lumbar corset applied showed good alignment. Physical therapy recommended home health PT on discharge, which was  arranged.   Issues for Follow Up:  1. Follow up normal healing process of compression fracture, pain control, rehabilitation 2. Follow up constipation with high dose opioid use.   Significant Procedures: None  Significant Labs and Imaging:   Recent Labs Lab 12/18/14 1535 12/19/14 0439  WBC 15.5* 9.1  HGB 15.9* 13.9  HCT 46.3* 43.6  PLT 205 196    Recent Labs Lab 12/18/14 1535 12/19/14 0439  NA 139 141  K 4.0 4.3  CL 104 102  CO2 25 31  GLUCOSE 140* 125*  BUN 7 8  CREATININE 0.83 0.86  CALCIUM 9.7 9.5  -Xray lumbar spine (9/29): Endplate degenerative change with inferior T12 and superior L1 minimal endplate depression of unknown chronicity. No bony retropulsion. -Xray thoracic spine (9/29): No evidence of thoracic spine fracture. -CT lumbar spine (9/29): Acute anterior compressure fracture T12 vertebral body with minimal anterior height loss. Age-indeterminate superior endplate anterior compression deformity of L1. -Xray lumbar spine (9/30): Compression fractures are noted at T12 and L1 as seen on prior CT. Normal alignment. No visualized retropulsed fracture fragments.  IMPRESSION: Mild T12 and L1 compression fractures again noted.  Results/Tests Pending at Time of Discharge: None  Discharge Medications:    Medication List    ASK your doctor about these medications        clindamycin 300 MG capsule  Commonly known as:  CLEOCIN  Take 1 capsule (300 mg total) by mouth 3 (three) times daily.     diphenhydrAMINE 25 mg capsule  Commonly known as:  BENADRYL  Take 50 mg by mouth every 6 (six) hours as needed.     naproxen sodium 220 MG tablet  Commonly known as:  ANAPROX  Take 440 mg by mouth 2 (two) times daily with a meal.     pseudoephedrine 30 MG tablet  Commonly known as:  SUDAFED  Take 30 mg by mouth every 4 (four) hours as needed for congestion (allergies).        Discharge Instructions: Please refer to Patient Instructions section of EMR for full  details.  Patient was counseled important signs and symptoms that should prompt return to medical care, changes in medications, dietary instructions, activity restrictions, and follow up appointments.   Follow-Up Appointments:     Follow-up Information    Follow up with [facility].   Why:  HOME HEALTH PHYSICAL THERAPY   Contact information:   [address] [place] [address] [phone]       Follow up with [facility].   Why:  Call this number, follow the prompts to get a primary care physician   Contact information:   [phone]      Laura Pour, MD 12/21/2014, 8:51 AM PGY-3, [place]

## 2014-12-21 NOTE — Discharge Instructions (Addendum)
Ms. Faryal, Marxen were admitted for compression fractures in your lower back. These are stable, so the pain related to them should continue to improve over time.  Treat pain as follows:  - Continue naproxen as directed every day for at least the next month - Continue morphine extended release (MS contin) 15mg  twice a day - Continue morphine (MSIR) 15mg  as needed for pain   - These have be prescribed and it is expected that your requirement of these will decrease steadily over time. You may not take the full quantity of pills prescribed - Continue to take calcitonin by nasal inhalation daily in alternating nostrils. This, too, has been prescribed.   Take miralax twice daily as long as you are taking pain medications to stay ahead of constipation.   You will be called early this next week to establish care at the [facility] center. You will need an appointment for hospital follow up within the next couple weeks and an appointment with Dr. Bonner Puna to establish him as your primary care physician.   Though not expected, if you experience worsening back pain, any current bowel/bladder problems, fever, chills, unintentional weight loss, night time awakenings secondary to pain or weakness in one or both legs you should seek medical attention right away.   We hope you feel better soon,  - Family Medicine Teaching Service

## 2014-12-25 ENCOUNTER — Telehealth: Payer: Self-pay | Admitting: General Practice

## 2014-12-25 NOTE — Telephone Encounter (Signed)
Need to change rx for the calcitonin, salmon nasal spray to something else that's cheaper.  Insurance company doesn't cover the $100 price of medication.

## 2014-12-26 NOTE — Telephone Encounter (Signed)
There is no replacement for calcitonin that is cheaper, though the generic version is $30.71 cash pay price at [employer] with a coupon she can print at [employer]. I have also printed this coupon and put her name on it. It is up front if she'd rather pick that up. Otherwise, she'll just have to take the oral medications we prescribed.

## 2014-12-29 NOTE — Telephone Encounter (Signed)
Spoke to pt and gave her the information below. She will pick up coupon at her next appointment on 01/06/2015. Ottis Stain, CMA

## 2015-01-06 ENCOUNTER — Ambulatory Visit (INDEPENDENT_AMBULATORY_CARE_PROVIDER_SITE_OTHER): Payer: No Typology Code available for payment source | Admitting: Family Medicine

## 2015-01-06 ENCOUNTER — Encounter: Payer: Self-pay | Admitting: Family Medicine

## 2015-01-06 VITALS — BP 149/80 | HR 104 | Temp 98.6°F | Ht 68.5 in

## 2015-01-06 DIAGNOSIS — S32018A Other fracture of first lumbar vertebra, initial encounter for closed fracture: Secondary | ICD-10-CM

## 2015-01-06 DIAGNOSIS — Z09 Encounter for follow-up examination after completed treatment for conditions other than malignant neoplasm: Secondary | ICD-10-CM

## 2015-01-06 MED ORDER — MORPHINE SULFATE 15 MG PO TABS: 15.0000 mg | ORAL_TABLET | Freq: Four times a day (QID) | ORAL | Status: DC | PRN

## 2015-01-06 MED ORDER — MORPHINE SULFATE ER 15 MG PO TBCR: 15.0000 mg | EXTENDED_RELEASE_TABLET | Freq: Two times a day (BID) | ORAL | Status: DC

## 2015-01-06 NOTE — Progress Notes (Signed)
Subjective: Quanika Butrick is a 59 y.o. female new patient of mine, accompanied by her fiance, presenting for hospital follow up.   She was admitted to [facility] on FMTS 9/29 - 10/2 after slipping on cat urine in her kitchen and falling on her back with resultant compression fractures of T12 and L1. She achieved adequate pain control and was discharged with both long and short acting opioid analgesics. She has been getting home health physical therapy through [facility]. She reports continually improving pain, and using long acting medications as directed but less than 1 prn dose per day. She denies drowsiness, changers in sleeping, mood, or appetite, constipation, or recurrent falls. Enjoys walking and swimming   - Declines flu shot - ROS: Denies fever, chills, weight loss, changes in vision or hearing, headache, cough, sore throat, chest pain, palpitations, shortness of breath, abdominal pain, nausea, vomiting, changes in bowel habits, blood in stool, change in bladder habits, myalgias, arthralgias, and rash.  - Former smoker, drinks < 2 days per month < 2 drinks per episode, no illicit drugs - PHQ-2: negative - Feels safe in relationships.   Objective: BP 149/80 mmHg  Pulse 104  Temp(Src) 98.6 F (37 C) (Oral)  Ht 5' 8.5" (1.74 m)  Wt  Gen: Pleasant, well-appearing 59 y.o. female in no distress CV: Regular rate, no murmur; radial, DP and PT pulses 2+ bilaterally; no LE edema, no JVD, cap refill < 2 sec. Pulm: Non-labored breathing ambient air; CTAB, no wheezes or crackles Back: Normal skin, wearing brace. Spine with normal alignment and no deformity. Some tenderness to thoraco-lumbar vertebral process palpation. Paraspinous muscles are not tender and without spasm. Range of motion is full at neck and limited lumbar sacral regions. Straight leg raise is negative. Neuro:  Sensation and motor function 5/5 bilateral lower extremities. Patellar and achilles DTR's 2+.  -Xray lumbar spine  (9/29): Endplate degenerative change with inferior T12 and superior L1 minimal endplate depression of unknown chronicity. No bony retropulsion. -Xray thoracic spine (9/29): No evidence of thoracic spine fracture. -CT lumbar spine (9/29): Acute anterior compressure fracture T12 vertebral body with minimal anterior height loss. Age-indeterminate superior endplate anterior compression deformity of L1. -Xray lumbar spine (9/30): Compression fractures are noted at T12 and L1 as seen on prior CT. Normal alignment. No visualized retropulsed fracture fragments.  Assessment/Plan: Carmel Garfield is a 59 y.o. female here for traumatic vertebral compression fracture hospital follow up.  L1 vertebral fracture No evidence of abnormal healing. Discussed expected return of function and diminished pain/use of analgesics. Will cut to 1 tablet of MScontin daily and told to self-taper off this as able, and use prn doses to do so. Follow up 4 weeks. Coupon given for calictonin.

## 2015-01-06 NOTE — Patient Instructions (Signed)
Thank you for coming in today!  - Try to wean down on pain medications - Continue PT as able, if you need any more visits that aren't covered that [facility] believes would help, please get me involved to help however I can.   Our clinic's number is [phone]. Feel free to call any time with questions or concerns. We will answer any questions after hours with our 24-hour emergency line at that number as well.   - Dr. Bonner Puna

## 2015-01-07 NOTE — Assessment & Plan Note (Signed)
>>  ASSESSMENT AND PLAN FOR L1 VERTEBRAL FRACTURE (HCC) WRITTEN ON 01/07/2015  7:27 PM BY Hazeline Junker B, MD  No evidence of abnormal healing. Discussed expected return of function and diminished pain/use of analgesics. Will cut to 1 tablet of MScontin daily and told to self-taper off this as able, and use prn doses to do so. Follow up 4 weeks. Coupon given for calictonin.

## 2015-01-07 NOTE — Assessment & Plan Note (Signed)
No evidence of abnormal healing. Discussed expected return of function and diminished pain/use of analgesics. Will cut to 1 tablet of MScontin daily and told to self-taper off this as able, and use prn doses to do so. Follow up 4 weeks. Coupon given for calictonin.

## 2015-01-09 ENCOUNTER — Telehealth: Payer: Self-pay | Admitting: Family Medicine

## 2015-01-09 NOTE — Telephone Encounter (Signed)
Laura Kaufman from [facility] is calling and states that it was understood between the pt and her provider that more physical therapy would be needed. She is calling to get the verbal authorization for PT 2x a week for 3 weeks. Sadie Reynolds, ASA

## 2015-03-03 ENCOUNTER — Ambulatory Visit (INDEPENDENT_AMBULATORY_CARE_PROVIDER_SITE_OTHER): Payer: No Typology Code available for payment source | Admitting: Family Medicine

## 2015-03-03 ENCOUNTER — Encounter: Payer: Self-pay | Admitting: Family Medicine

## 2015-03-03 ENCOUNTER — Other Ambulatory Visit: Payer: Self-pay | Admitting: Family Medicine

## 2015-03-03 VITALS — BP 138/96 | HR 96 | Temp 98.1°F | Ht 69.0 in | Wt 196.1 lb

## 2015-03-03 DIAGNOSIS — Z1239 Encounter for other screening for malignant neoplasm of breast: Secondary | ICD-10-CM | POA: Diagnosis not present

## 2015-03-03 DIAGNOSIS — Z1159 Encounter for screening for other viral diseases: Secondary | ICD-10-CM

## 2015-03-03 DIAGNOSIS — Z1231 Encounter for screening mammogram for malignant neoplasm of breast: Secondary | ICD-10-CM

## 2015-03-03 DIAGNOSIS — Z0000 Encounter for general adult medical examination without abnormal findings: Secondary | ICD-10-CM | POA: Insufficient documentation

## 2015-03-03 DIAGNOSIS — Z8349 Family history of other endocrine, nutritional and metabolic diseases: Secondary | ICD-10-CM | POA: Diagnosis not present

## 2015-03-03 DIAGNOSIS — Z114 Encounter for screening for human immunodeficiency virus [HIV]: Secondary | ICD-10-CM

## 2015-03-03 DIAGNOSIS — Z23 Encounter for immunization: Secondary | ICD-10-CM | POA: Diagnosis not present

## 2015-03-03 DIAGNOSIS — R7301 Impaired fasting glucose: Secondary | ICD-10-CM | POA: Diagnosis not present

## 2015-03-03 DIAGNOSIS — S32018A Other fracture of first lumbar vertebra, initial encounter for closed fracture: Secondary | ICD-10-CM | POA: Diagnosis not present

## 2015-03-03 LAB — POCT GLYCOSYLATED HEMOGLOBIN (HGB A1C): HEMOGLOBIN A1C: 5.6

## 2015-03-03 LAB — HEPATITIS C ANTIBODY: HCV AB: NEGATIVE

## 2015-03-03 LAB — HIV ANTIBODY (ROUTINE TESTING W REFLEX): HIV: NONREACTIVE

## 2015-03-03 LAB — TSH: TSH: 2.301 u[IU]/mL (ref 0.350–4.500)

## 2015-03-03 MED ORDER — MORPHINE SULFATE 15 MG PO TABS: 15.0000 mg | ORAL_TABLET | Freq: Four times a day (QID) | ORAL | Status: DC | PRN

## 2015-03-03 NOTE — Progress Notes (Signed)
Subjective: Ellierose Hailu is a 59 y.o. female presenting for follow up of traumatic compression fracture at the end of Sept 2016.   She reports improvement in lower back pain which is usually mild, but severe with some days of increased exertion requiring prn morphine. Not radiating. Hasn't used MS-CONTIN for > 1 month and still has some morphine IR left. Has been walking 1 mile per day with the help of a cane on her gravel driveway.   - HM: Due for Hep C, HIV; last tetanus 30 years ago, declines flu vaccine; last mammogram 06/28/2006 and normal (no abnormalities noted on SBE), last colonoscopy > 10 years ago reportedly normal, s/p hysterectomy 1986 for a bleeding nonmalignant fibroid - not sure whether they took the cervix or not. - +FH hyperthyroidism. Will check TSH today.  - ROS: Pt denies any current bowel/bladder problems, fever, chills, unintentional weight loss, night time awakenings secondary to pain, weakness in one or both legs. - Non-smoker  Objective: BP 138/96 mmHg  Pulse 96  Temp(Src) 98.1 F (36.7 C) (Oral)  Ht 5\' 9"  (1.753 m)  Wt 196 lb 1.6 oz (88.95 kg)  BMI 28.95 kg/m2  SpO2 95%   Wt Readings from Last 3 Encounters:  03/03/15 196 lb 1.6 oz (88.95 kg)  12/18/14 205 lb 6.4 oz (93.169 kg)  06/07/14 199 lb (90.266 kg)   Gen: Well-appearing, pleasant 59 y.o. female in no distress CV: Regular rate, no murmur; radial, DP and PT pulses 2+ bilaterally; no LE edema, no JVD, cap refill < 2 sec. Pulm: Non-labored breathing ambient air; CTAB, no wheezes or crackles Back: Wearing back brace. Spine with normal alignment without step offs. Very mild tenderness to lumbar vertebral process palpation. Paraspinous muscles are not tender and without spasm. Range of motion is full at neck and lumbar sacral regions. Straight leg raise is negative. Neuro:  Sensation and motor function 5/5 bilateral lower extremities. Patellar and achilles DTR's 2+  Assessment/Plan: Shakilya Forgues is a 59  y.o. female here for pain following lumbar compression fracture . Family history of hyperthyroidism With recent weight loss, albeit intentional, will check TSH.   L1 vertebral fracture Doing well, weaning from analgesics with increasing activity (lost 9 lbs.). Will refill morphine IR #30 and do not suspect this will be required any further. Continue NSAIDs as well and activity as tolerated. Follow up 3 months or prn.   Healthcare maintenance: HIV, Hep C, tetanus, mammogram referral   Fasting hyperglycemia noted on labs while inpatient in Sept 2016: No Hb A1c on file, so will check this today.

## 2015-03-03 NOTE — Assessment & Plan Note (Signed)
With recent weight loss, albeit intentional, will check TSH.

## 2015-03-03 NOTE — Patient Instructions (Signed)
Thank you for coming in today and AMAZING job walking every day. I can tell you feel so great and it is doing a lot for your long term health!  We are checking some labs today, and we'll call you if they are abnormal and send you a letter  We will attempt to get [facility] from [facility] regarding your hyste[facility]  [facility]  - Let's follow up in 2 - 3 months to see how your pain is doing.   Our clinic's number is [phone]. Feel free to call any time with questions or concerns. We will answer any questions after hours with our 24-hour emergency line at that number as well.   - Dr. Bonner Puna

## 2015-03-03 NOTE — Assessment & Plan Note (Signed)
Doing well, weaning from analgesics with increasing activity (lost 9 lbs.). Will refill morphine IR #30 and do not suspect this will be required any further. Continue NSAIDs as well and activity as tolerated. Follow up 3 months or prn.

## 2015-03-03 NOTE — Assessment & Plan Note (Signed)
>>  ASSESSMENT AND PLAN FOR L1 VERTEBRAL FRACTURE (HCC) WRITTEN ON 03/03/2015  9:59 AM BY Hazeline Junker B, MD  Doing well, weaning from analgesics with increasing activity (lost 9 lbs.). Will refill morphine IR #30 and do not suspect this will be required any further. Continue NSAIDs as well and activity as tolerated. Follow up 3 months or prn.

## 2015-03-04 NOTE — Addendum Note (Signed)
Addended by: Londell Moh T on: 03/04/2015 12:15 PM   Modules accepted: Orders

## 2015-03-06 ENCOUNTER — Encounter (HOSPITAL_COMMUNITY): Payer: Self-pay | Admitting: Family Medicine

## 2015-03-06 ENCOUNTER — Encounter: Payer: Self-pay | Admitting: Family Medicine

## 2015-03-27 ENCOUNTER — Ambulatory Visit: Payer: No Typology Code available for payment source

## 2015-04-07 ENCOUNTER — Ambulatory Visit
Admission: RE | Admit: 2015-04-07 | Discharge: 2015-04-07 | Disposition: A | Discharge status: Home / Self Care | Payer: BLUE CROSS/BLUE SHIELD | Source: Ambulatory Visit | Attending: Family Medicine | Admitting: Family Medicine

## 2015-04-07 DIAGNOSIS — Z1231 Encounter for screening mammogram for malignant neoplasm of breast: Secondary | ICD-10-CM

## 2015-05-14 ENCOUNTER — Encounter: Payer: Self-pay | Admitting: Family Medicine

## 2015-05-14 ENCOUNTER — Ambulatory Visit (INDEPENDENT_AMBULATORY_CARE_PROVIDER_SITE_OTHER): Payer: BLUE CROSS/BLUE SHIELD | Admitting: Family Medicine

## 2015-05-14 VITALS — BP 151/103 | HR 113 | Temp 98.8°F | Wt 200.3 lb

## 2015-05-14 DIAGNOSIS — J029 Acute pharyngitis, unspecified: Secondary | ICD-10-CM | POA: Diagnosis not present

## 2015-05-14 LAB — POCT RAPID STREP A (OFFICE): RAPID STREP A SCREEN: NEGATIVE

## 2015-05-14 NOTE — Patient Instructions (Signed)
You have a virus.  You can use tylenol and ibuprofen as needed for the sore throat.  You can use Afrin nasal spray for 3 days to help with the congestion and sore throat.  You can also use Flonase nasal spray long term.  If you are not getting better in 7-10 days, please let us know.  Take care,  Dr Jerline Pain

## 2015-05-14 NOTE — Progress Notes (Signed)
    Subjective:  Laura Kaufman is a 60 y.o. female who presents to the [facility] today with a chief complaint of sore throat.   HPI:  Sore Throat Symptoms started 2 days ago. Associated symptoms include cough, rhinorrhea, and nasal congestion. Has tried sudafed which helps. No fevers.  Last year, had similar symptoms and ended up having a tonsillar abscess. Patient is worried that she may be having the same thing happening again.   ROS: Per HPI  Objective:  Physical Exam: BP 151/103 mmHg  Pulse 113  Temp(Src) 98.8 F (37.1 C) (Oral)  Wt 200 lb 4.8 oz (90.855 kg)  Gen: NAD, resting comfortably HEENT: -Mouth: O/P erythematous without exudates -Neck: No LAD noted.  CV: RRR with no murmurs appreciated Pulm: NWOB, CTAB with no crackles, wheezes, or rhonchi MSK: no edema, cyanosis, or clubbing noted Skin: warm, dry Neuro: grossly normal, moves all extremities  Results for orders placed or performed in visit on 05/14/15 (from the past 72 hour(s))  POCT rapid strep A     Status: None   Collection Time: 05/14/15  5:10 PM  Result Value Ref Range   Rapid Strep A Screen Negative Negative     Assessment/Plan:  Sore Throat Likely viral URI. Rapid strep test obtained which was negative. Will send for culture. No other signs of bacterial infection. Will treat symptomatically. Return precautions reviewed. Follow up as needed.   Algis Greenhouse. Jerline Pain, [place] [facility] Resident PGY-2 05/14/2015 5:43 PM

## 2015-05-15 LAB — STREP A DNA PROBE: GASP: NOT DETECTED

## 2015-05-18 ENCOUNTER — Encounter: Payer: Self-pay | Admitting: Family Medicine

## 2016-03-22 IMAGING — MG MM DIGITAL SCREENING BILAT
4 series · 4 of 4 positions shown · non-contrast
Comparison: None.

ACR Breast Density Category a: The breast tissue is almost entirely
fatty.

CLINICAL DATA: Screening.

EXAM:
DIGITAL SCREENING BILATERAL MAMMOGRAM WITH CAD

[L MLO]
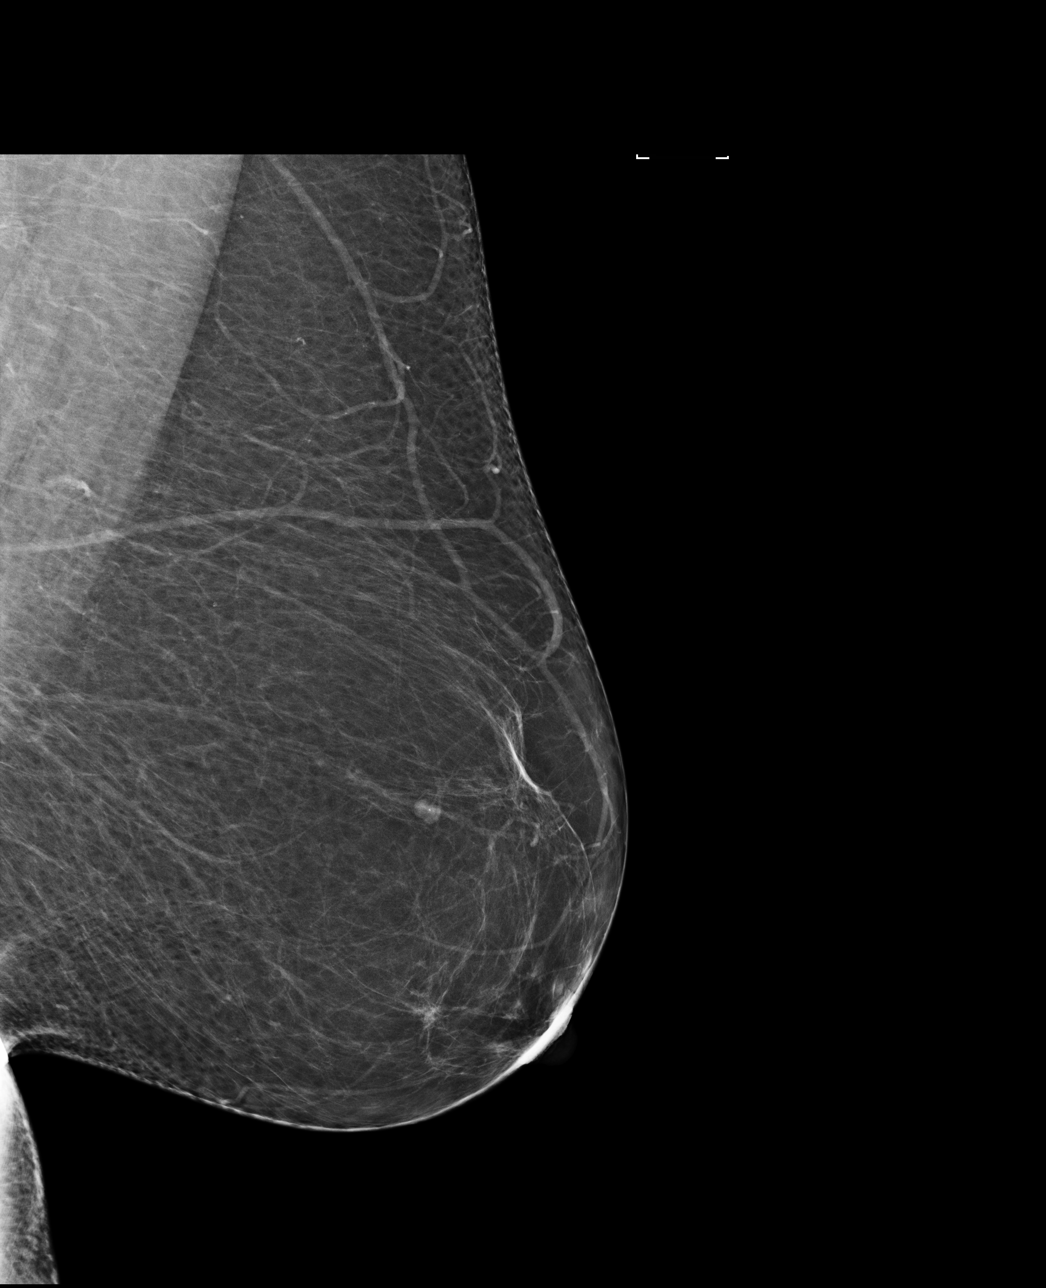

[L CC]
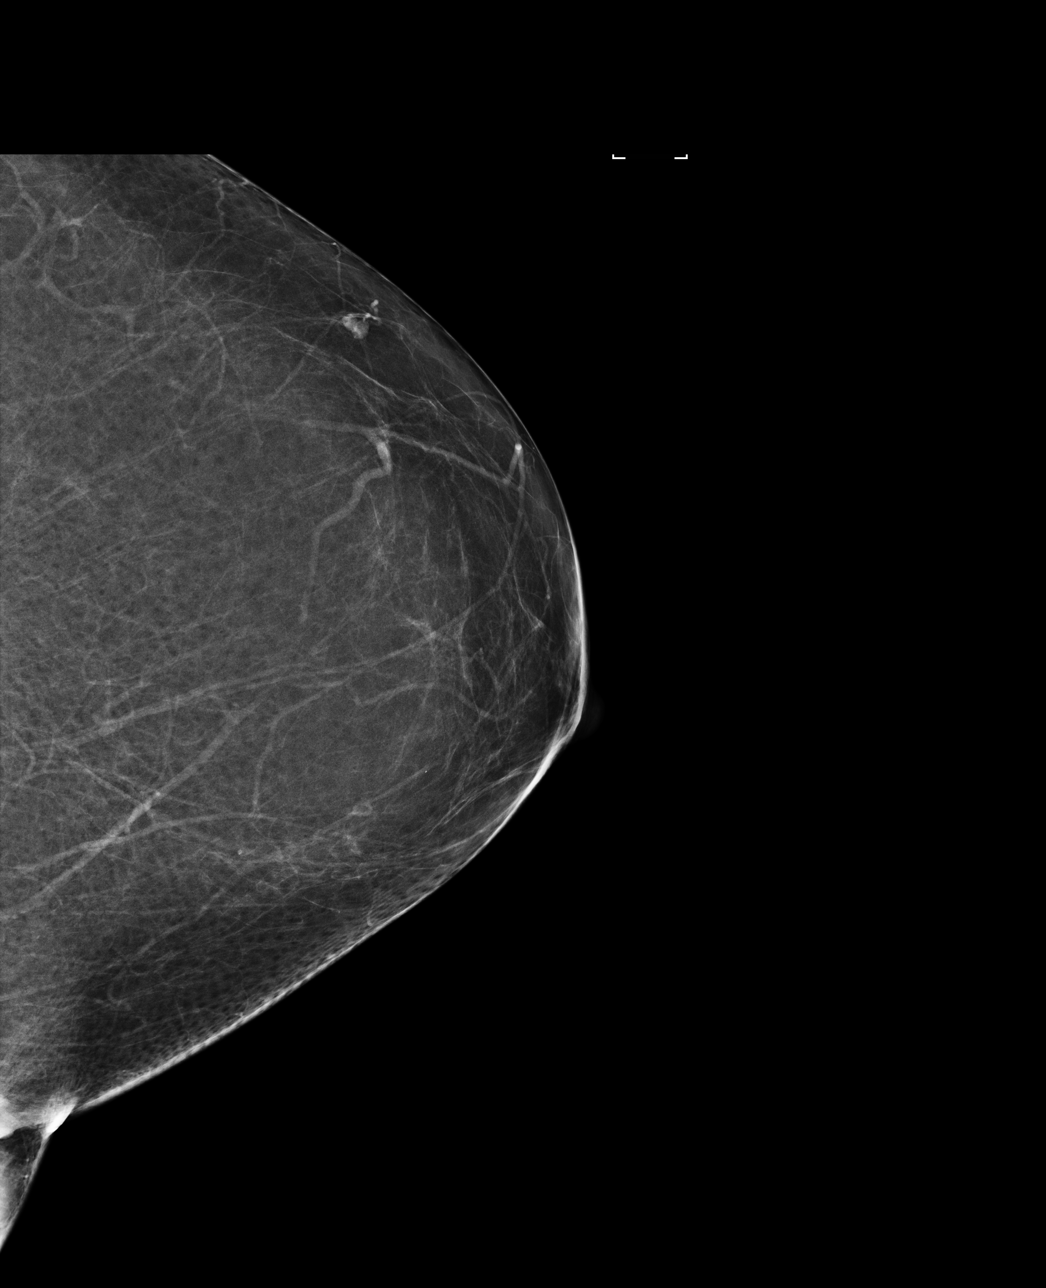

[R MLO]
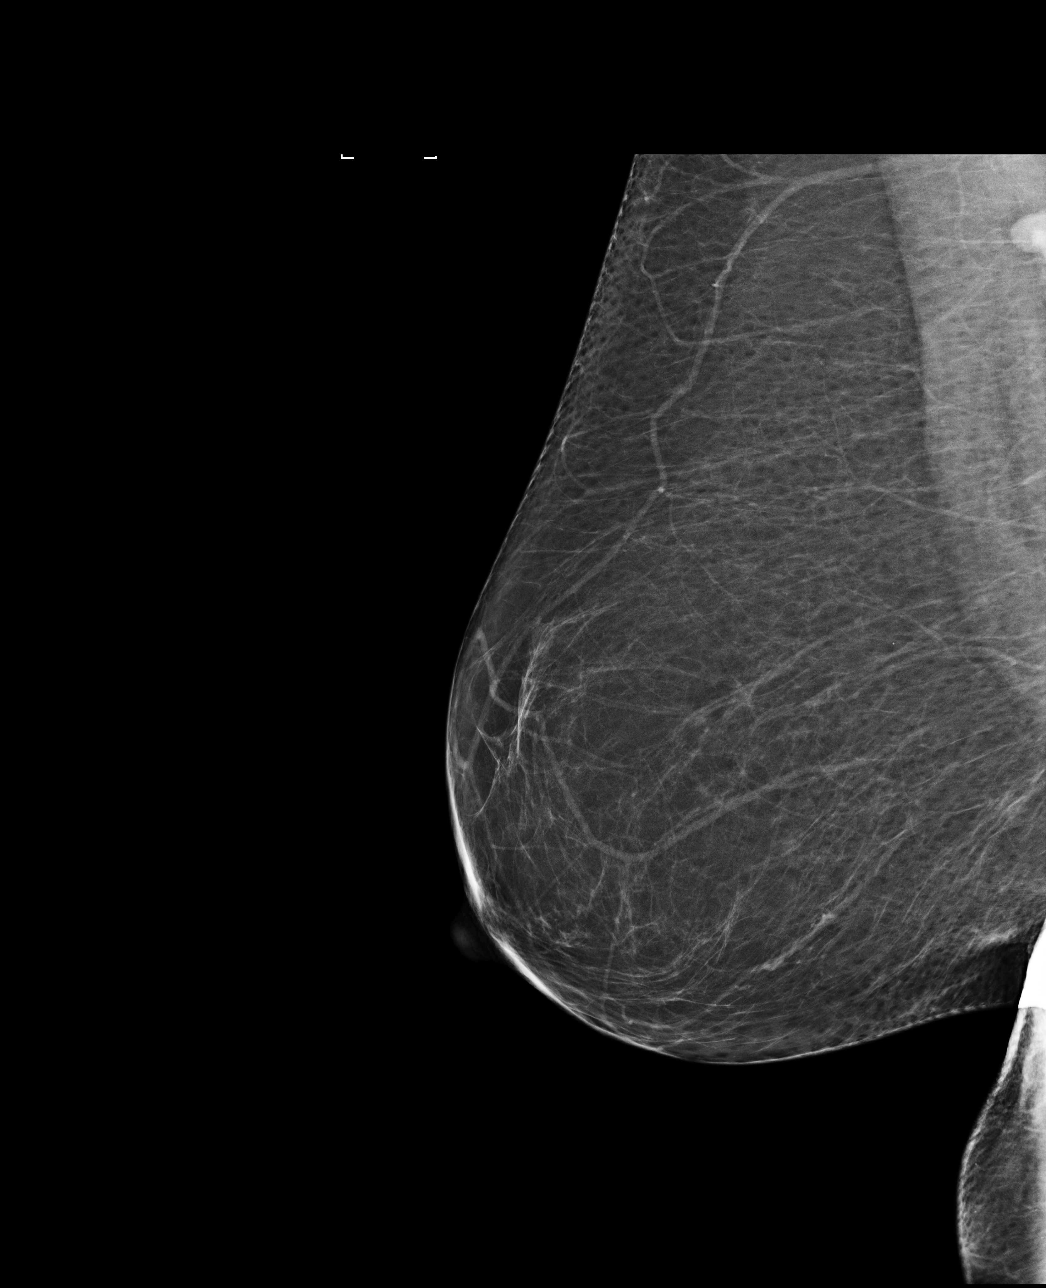

[R CC]
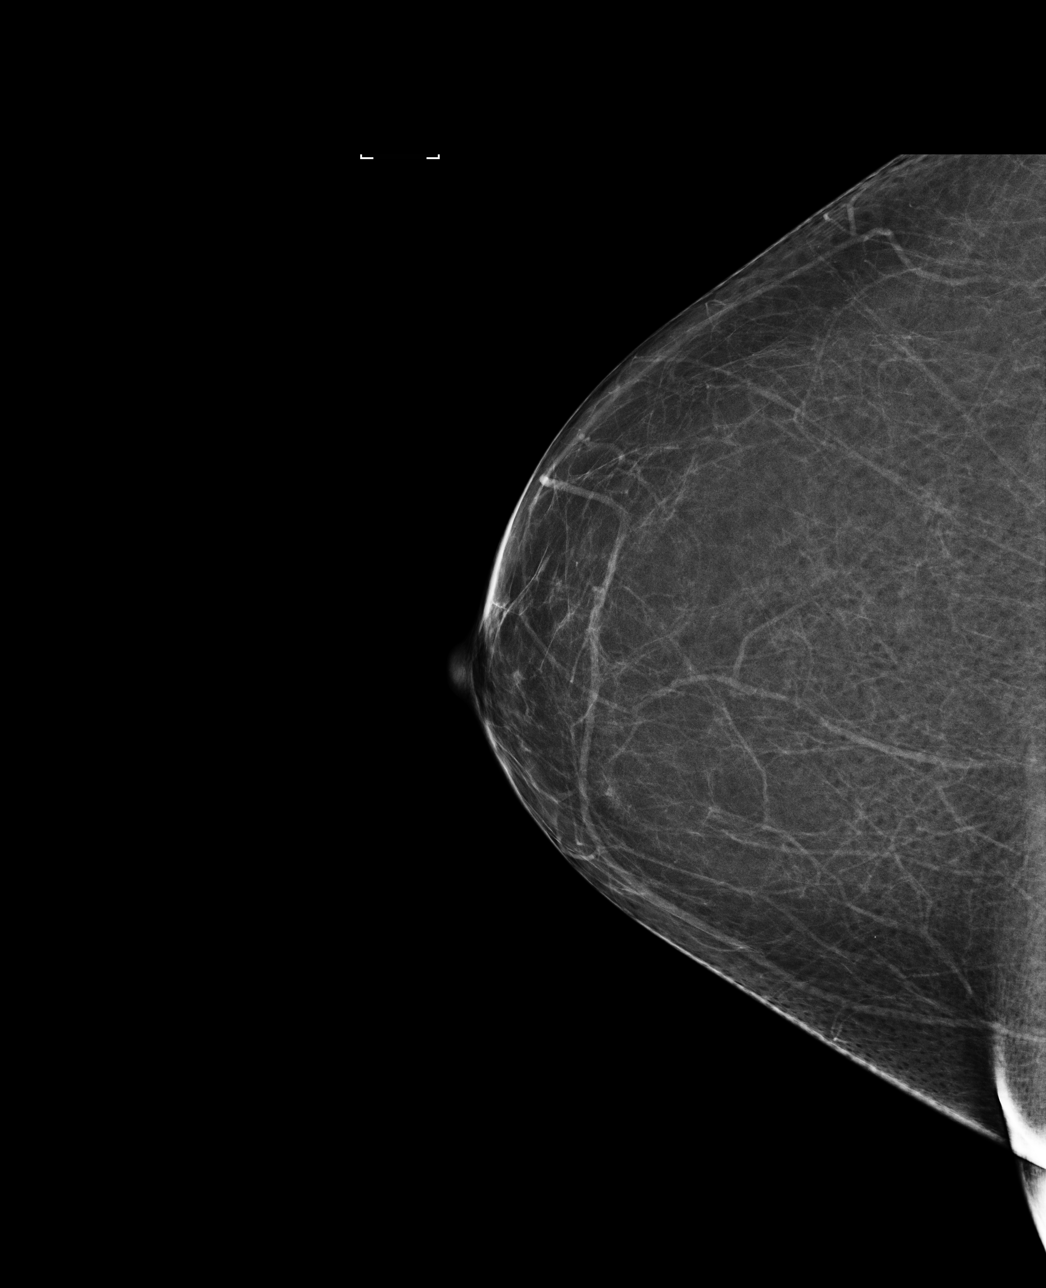

[4 of 4 positions shown; findings below may reference images not displayed]

FINDINGS: There are no findings suspicious for malignancy. Images were
processed with CAD.
IMPRESSION: No mammographic evidence of malignancy. A result letter of this
screening mammogram will be mailed directly to the patient.

RECOMMENDATION:
Screening mammogram in one year. (Code:[id number])

BI-RADS CATEGORY  1: Negative.

## 2016-07-09 DIAGNOSIS — F3110 Bipolar disorder, current episode manic without psychotic features, unspecified: Secondary | ICD-10-CM | POA: Insufficient documentation

## 2016-07-09 HISTORY — DX: Bipolar disorder, current episode manic without psychotic features, unspecified: F31.10

## 2016-10-21 ENCOUNTER — Telehealth: Payer: Self-pay | Admitting: Student

## 2016-10-21 NOTE — Telephone Encounter (Signed)
Pt has poison ivy on her face. Pt spoke with her pharmacist and was told she needs prednisone. Pt uses [facility] in [place]. ep

## 2016-10-21 NOTE — Telephone Encounter (Signed)
LVM for pt to call. If pt calls, please make her an appt to be seen or have her go to Urgent Care.  She has not been seen in this office in over a year. Ottis Stain, CMA

## 2016-10-21 NOTE — Telephone Encounter (Signed)
2nd request. Pt wants this filled today. ep

## 2016-10-21 NOTE — Telephone Encounter (Signed)
Pt was advised, but declined appt. ep

## 2016-10-26 ENCOUNTER — Ambulatory Visit (INDEPENDENT_AMBULATORY_CARE_PROVIDER_SITE_OTHER): Payer: BLUE CROSS/BLUE SHIELD | Admitting: Internal Medicine

## 2016-10-26 ENCOUNTER — Encounter: Payer: Self-pay | Admitting: Internal Medicine

## 2016-10-26 VITALS — BP 110/68 | HR 104 | Temp 98.7°F | Wt <= 1120 oz

## 2016-10-26 DIAGNOSIS — L237 Allergic contact dermatitis due to plants, except food: Secondary | ICD-10-CM | POA: Diagnosis not present

## 2016-10-26 MED ORDER — PREDNISONE 10 MG PO TABS: ORAL_TABLET | ORAL | 0 refills | Status: DC

## 2016-10-26 NOTE — Progress Notes (Signed)
   Subjective:    Laura Kaufman - 60 y.o. female MRN [id number]  Date of birth: 02-02-1956  HPI  Laura Kaufman is here for rash. Reports she has had about one week of rash present after contact with poison ivy while cutting trees/bushes in her yard. Rash is present on face and on her arms. It is very itchy but not painful. No fevers present. No throat swelling or difficulty swallowing. No oral lesions. No nausea or vomiting. Has been applying Hydrocortisone cream and taking Zyrtec without much relief of symptoms.    -  reports that she has never smoked. She has never used smokeless tobacco. - Review of Systems: Per HPI. - Past Medical History: Patient Active Problem List   Diagnosis Date Noted  . Breast cancer screening 03/03/2015  . Family history of hyperthyroidism 03/03/2015  . Fall from slip, trip, or stumble   . Lumbar compression fracture ([place])   . L1 vertebral fracture ([place]) 12/18/2014  . Back pain 12/18/2014   - Medications: reviewed and updated   Objective:   Physical Exam BP 110/68   Pulse (!) 104   Temp 98.7 F (37.1 C) (Oral)   Wt 19 lb 9.6 oz (8.891 kg)   SpO2 97%   BMI 2.89 kg/m  Gen: NAD, alert, cooperative with exam, well-appearing Skin: linear areas of erythema with some vesicles and evidence of excoriations on cheeks and extensor surfaces of arms bilaterally     Assessment & Plan:   1. Contact dermatitis due to poison ivy Exam and history consistent with contact dermatitis from poison ivy. No surrounding erythema, pain or systemic symptoms to suggest cellulitis.  - predniSONE (DELTASONE) 10 MG tablet; Take 40 mg x7 days, then 30 mg x7 days, then 20 mg x7 days.  Dispense: 63 tablet; Refill: 0 -Benadryl at nighttime for itchy; continue Zyrtec during the day  -return precautions discussed   Phill Myron, D.O. 10/26/2016, 2:29 PM PGY-3, [place]

## 2016-10-26 NOTE — Patient Instructions (Signed)
I have prescribed a prednisone taper for your poison ivy contact. Continue Benadryl and Zyrtec as needed.    Poison Ivy Dermatitis Poison ivy dermatitis is redness and soreness (inflammation) of the skin. It is caused by a chemical that is found on the leaves of the poison ivy plant. You may also have itching, a rash, and blisters. Symptoms often clear up in 1-2 weeks. You may get this condition by touching a poison ivy plant. You can also get it by touching something that has the chemical on it. This may include animals or objects that have come in contact with the plant. Follow these instructions at home: General instructions  Take or apply over-the-counter and prescription medicines only as told by your doctor.  If you touch poison ivy, wash your skin with soap and cold water right away.  Use hydrocortisone creams or calamine lotion as needed to help with itching.  Take oatmeal baths as needed. Use colloidal oatmeal. You can get this at a pharmacy or grocery store. Follow the instructions on the package.  Do not scratch or rub your skin.  While you have the rash, wash your clothes right after you wear them. Prevention  Know what poison ivy looks like so you can avoid it. This plant has three leaves with flowering branches on a single stem. The leaves are glossy. They have uneven edges that come to a point at the front.  If you have touched poison ivy, wash with soap and water right away. Be sure to wash under your fingernails.  When hiking or camping, wear long pants, a long-sleeved shirt, tall socks, and hiking boots. You can also use a lotion on your skin that helps to prevent contact with the chemical on the plant.  If you think that your clothes or outdoor gear came in contact with poison ivy, rinse them off with a garden hose before you bring them inside your house. Contact a doctor if:  You have open sores in the rash area.  You have more redness, swelling, or pain in the  affected area.  You have redness that spreads beyond the rash area.  You have fluid, blood, or pus coming from the affected area.  You have a fever.  You have a rash over a large area of your body.  You have a rash on your eyes, mouth, or genitals.  Your rash does not get better after a few days. Get help right away if:  Your face swells or your eyes swell shut.  You have trouble breathing.  You have trouble swallowing. This information is not intended to replace advice given to you by your health care provider. Make sure you discuss any questions you have with your health care provider. Document Released: 04/09/2010 Document Revised: 08/13/2015 Document Reviewed: 08/13/2014 Elsevier Interactive Patient Education  [employer].

## 2017-04-27 ENCOUNTER — Ambulatory Visit: Payer: BLUE CROSS/BLUE SHIELD | Admitting: Student

## 2017-04-27 ENCOUNTER — Encounter: Payer: Self-pay | Admitting: Student

## 2017-04-27 ENCOUNTER — Other Ambulatory Visit: Payer: Self-pay

## 2017-04-27 VITALS — BP 138/88 | HR 87 | Temp 98.4°F | Ht 69.0 in | Wt 206.0 lb

## 2017-04-27 DIAGNOSIS — R0683 Snoring: Secondary | ICD-10-CM | POA: Diagnosis not present

## 2017-04-27 DIAGNOSIS — M25562 Pain in left knee: Secondary | ICD-10-CM | POA: Diagnosis not present

## 2017-04-27 DIAGNOSIS — T485X1A Poisoning by other anti-common-cold drugs, accidental (unintentional), initial encounter: Secondary | ICD-10-CM

## 2017-04-27 DIAGNOSIS — J310 Chronic rhinitis: Secondary | ICD-10-CM | POA: Diagnosis not present

## 2017-04-27 DIAGNOSIS — T485X5A Adverse effect of other anti-common-cold drugs, initial encounter: Secondary | ICD-10-CM

## 2017-04-27 MED ORDER — NAPROXEN 500 MG PO TABS
500.0000 mg | ORAL_TABLET | Freq: Two times a day (BID) | ORAL | 0 refills | Status: DC
Start: 2017-04-27 — End: 2017-08-23

## 2017-04-27 NOTE — Patient Instructions (Signed)
It was great seeing you today! We have addressed the following issues today  Knee pain: This is likely due to osteoarthritis.  There could be some element of inflammation of the ligaments as well.  As he requested, we send a referral to the orthopedic surgeon.  Someone will get in touch with you about this referral in the next couple of weeks.  Snoring/nasal congestion: I strongly recommend stopping the nasal decongestant you are using currently.  You can safely use saline nose spray as needed.  If snoring continues to be an issue, you can come back and see [place].   If we did any lab work today, and the results require attention, either me or my nurse will get in touch with you. If everything is normal, you will get a letter in mail and a message via . If you don't hear from [place] in two weeks, please give [place] a call. Otherwise, we look forward to seeing you again at your next visit. If you have any questions or concerns before then, please call the clinic at [phone].  Please bring all your medications to every doctors visit  Sign up for My Chart to have easy access to your labs results, and communication with your Primary care physician.    Please check-out at the front desk before leaving the clinic.    Take Care,   Dr. Cyndia Skeeters

## 2017-04-27 NOTE — Progress Notes (Signed)
  Subjective:    Laura Kaufman is a 62 y.o. old female here for left knee pain and snoring. She is here with her husband.  HPI Left knee: this has been going on for two weeks to one month. She had knee surgery in 2005 after she fell and "shattered" her left knee cap at that time. No trauma or unusual activity recently.  Denies redness, fever. Reports swelling over the medial aspet. No new shoe wear. Husband thinks she is limping. Reports locking but no catching. Pain worse with standing and walking. Riding a bike for 10 minutes everyday. She has to cut down on this due to pain. She takes Ibuprofen at night Denies numbness or tingling. Admits weakness due to pain. Not working currently. She was Music therapist and remodeling houses.   Snoring: Reports history of nasal congestion.  She has been using Afrin daily.  Denies itchy eyes or itchy nose. Denies daytime sleepiness or fatigue. Denies history of OSA.  PMH/Problem List: has L1 vertebral fracture ([place]); Back pain; Fall from slip, trip, or stumble; Lumbar compression fracture ([place]); Breast cancer screening; and Family history of hyperthyroidism on their problem list.   has no past medical history on file.  FH:  No family history on file.  SH Social History   Tobacco Use  . Smoking status: Never Smoker  . Smokeless tobacco: Never Used  Substance Use Topics  . Alcohol use: Yes    Comment: occasionally  . Drug use: No    Review of Systems Review of systems negative except for pertinent positives and negatives in history of present illness above.     Objective:     Vitals:   04/27/17 1002  BP: 138/88  Pulse: 87  Temp: 98.4 F (36.9 C)  TempSrc: Oral  SpO2: 99%  Weight: 206 lb (93.4 kg)  Height: 5\' 9"  (1.753 m)   Body mass index is 30.42 kg/m.  Physical Exam  GEN: appears well, no apparent distress. CVS: RRR, nl s1 & s2, no murmurs, no edema RESP: no IWOB MSK: Left knee Previous surgical scar over left knee.  Mild swelling  medially.  No erythema or increased warmth to touch. Tenderness to palpation over joint line medially. ROM full in flexion and extension and lower leg rotation. Ligaments with solid consistent endpoints including ACL, PCL, LCL and MCL. Non painful patellar compression. She couldn't tolerate Thessaly test. [place] without mild pain medially Antalgic gait 4/5 with flexion and extension in left knee.  5/5 elsewhere  Normal light sensation  Patellar reflex diminished bilaterally.  She also history of knee surgery. PSYCH: euthymic mood with congruent affect Assessment and Plan:  1. Acute pain of left knee: likely flareup of osteoarthritis.  Cannot rule out meniscal injury medially.  Ligament with solid consistent endpoints.  No erythema or increased warmth to touch to suggest infectious etiology.  No history of gout.  -Recommend icing for about 10 minutes 3 times a day. -Tylenol 650 mg every 6 hours. -Gave her a prescription for naproxen 500 mg twice daily as needed for pain. -Ambulatory referral to Orthopedic Surgery  2. Snoring: Likely primary snoring without daytime sleepiness or fatigue.  Could be due to rhinitis medicamentosa.  She has been using Afrin daily.  Recommended stopping Afrin.  Recommended trying saline nose spray. If no improvement, we will consider sleep study  3. Rhinitis medicamentosa As above  Return if symptoms worsen or fail to improve.  Mercy Riding, MD 04/27/17 Pager: [phone]

## 2017-08-23 ENCOUNTER — Ambulatory Visit: Payer: BLUE CROSS/BLUE SHIELD | Admitting: Student

## 2017-08-23 ENCOUNTER — Encounter: Payer: Self-pay | Admitting: Student

## 2017-08-23 ENCOUNTER — Other Ambulatory Visit: Payer: Self-pay

## 2017-08-23 VITALS — BP 126/72 | HR 92 | Temp 98.6°F | Ht 69.0 in | Wt 202.0 lb

## 2017-08-23 DIAGNOSIS — R05 Cough: Secondary | ICD-10-CM

## 2017-08-23 DIAGNOSIS — R059 Cough, unspecified: Secondary | ICD-10-CM

## 2017-08-23 MED ORDER — FLUTICASONE PROPIONATE 50 MCG/ACT NA SUSP: 2.0000 | Freq: Every day | NASAL | 6 refills | Status: DC

## 2017-08-23 NOTE — Progress Notes (Signed)
  Subjective:    Laura Kaufman is a 62 y.o. old female here for cough  HPI Cough: for 2 months.  Started with cold symptoms.  Cough is productive with yellowish phlegm.  Denies hemoptysis.  She coughs a day and night.  Cough is getting worse to the extent of keeping her up at night. Denies itchy eyes or itchy nose, fever, chills, dyspnea, excessive night sweats, heartburn, reflux or sour taste in her mouth.  She admits postnasal drainage.  She also reports some pressure in her chest at times.  She denies exertional dyspnea or chest pain.  She denies a swelling in her legs.  She denies orthopnea or paroxysmal nocturnal dyspnea.  She uses 3 pillows at baseline for comfort.  She admits to snoring but husband has difficulty hearing.  She denies daytime sleepiness until recently when she started coughing.   PMH/Problem List: has L1 vertebral fracture ([place]); Back pain; Fall from slip, trip, or stumble; Lumbar compression fracture ([place]); Breast cancer screening; and Family history of hyperthyroidism on their problem list.   has no past medical history on file.  FH:  No family history on file.  SH Social History   Tobacco Use  . Smoking status: Never Smoker  . Smokeless tobacco: Never Used  Substance Use Topics  . Alcohol use: Yes    Comment: occasionally  . Drug use: No    Review of Systems Review of systems negative except for pertinent positives and negatives in history of present illness above.     Objective:     Vitals:   08/23/17 1009 08/23/17 1041  BP: (!) 142/86 126/72  Pulse: 92   Temp: 98.6 F (37 C)   TempSrc: Oral   SpO2: 97%   Weight: 202 lb (91.6 kg)   Height: 5\' 9"  (1.753 m)    Body mass index is 29.83 kg/m.  Physical Exam  GEN: appears well & comfortable. No apparent distress. Head: normocephalic and atraumatic  Eyes: conjunctiva without injection. Sclera anicteric. Nares: no rhinorrhea. Some swollen turbinates. No erythema of nasal mucosa Oropharynx: MMM. No  erythema.  Tonsillar hypertrophy. She is Mallampati IV.  HEM: negative for cervical or periauricular lymphadenopathies CVS: RRR, nl s1 & s2, no murmurs, no edema RESP: no IWOB, good air movement bilaterally, CTAB SKIN: no apparent skin lesion NEURO: alert and oiented appropriately, no gross deficits   PSYCH: euthymic mood with congruent affect    Assessment and Plan:  1. Cough: likely secondary to postnasal drainage.  It could also be residual from a viral illness but it should have gotten better by now.  She has no constitutional symptoms nor findings suggestive for cardiac etiology.  Cardiopulmonary exam within normal limits. She has no finding suggestive for GERD. Will treat for postnasal drainage with Flonase nasal spray.  I also recommended trying saline nose spray 4-5 times a day, and a tablespoonful of honey before bedtime.  If no improvement with these over the next 3 to 4 weeks, will obtain chest x-ray.   - fluticasone (FLONASE) 50 MCG/ACT nasal spray; Place 2 sprays into both nostrils daily.  Dispense: 16 g; Refill: 6  Return in about 1 month (around 09/22/2017) for Cough.  Mercy Riding, MD 08/23/17 Pager: [phone]

## 2017-08-23 NOTE — Patient Instructions (Signed)
It was great seeing you today! We have addressed the following issues today  Cough: this is likely due to drainage into the back of your throat.  We sent a prescription for Flonase nasal spray to your pharmacy.  Please use the nose spray as we discussed in the office.  I also suggest trying a tablespoonful of honey before bedtime.  You can also try saline nose spray 4-5 times a day as we discussed in the office.  Follow-up in clinic in 1 months if no improvement.  If we did any lab work today, and the results require attention, either me or my nurse will get in touch with you. If everything is normal, you will get a letter in mail and a message via . If you don't hear from [place] in two weeks, please give [place] a call. Otherwise, we look forward to seeing you again at your next visit. If you have any questions or concerns before then, please call the clinic at [phone].  Please bring all your medications to every doctors visit  Sign up for My Chart to have easy access to your labs results, and communication with your Primary care physician.    Please check-out at the front desk before leaving the clinic.    Take Care,   Dr. Cyndia Skeeters

## 2017-10-06 ENCOUNTER — Telehealth: Payer: Self-pay | Admitting: Family Medicine

## 2017-10-06 NOTE — Telephone Encounter (Signed)
LVM to schedule f/u. Please assist with do this

## 2019-01-29 ENCOUNTER — Other Ambulatory Visit: Payer: Self-pay

## 2019-01-29 ENCOUNTER — Encounter: Payer: Self-pay | Admitting: Gastroenterology

## 2019-01-29 ENCOUNTER — Telehealth (INDEPENDENT_AMBULATORY_CARE_PROVIDER_SITE_OTHER): Payer: BLUE CROSS/BLUE SHIELD | Admitting: Family Medicine

## 2019-01-29 DIAGNOSIS — R1310 Dysphagia, unspecified: Secondary | ICD-10-CM

## 2019-01-29 NOTE — Progress Notes (Signed)
[place] Telemedicine Visit  Patient consented to have virtual visit. Method of visit: Video  Encounter participants: Patient: Laura Kaufman - located at home  Provider: Bonnita Hollow - located at office Others (if applicable): None  Chief Complaint: Vomiting  HPI:  Patient reports that she has had vomiting regularly for the past year.  Is slowly getting worse.  Patient says that she has vomited after dinner every night for the past 3 nights.  Patient says that she has difficulty swallowing and feels that food gets stuck in her upper part of her throat.  Patient endorses intentional weight loss over the past year of about 25 pounds.  She does say that it has been easing the fact that she cannot eat very well and test smaller bites.  Does have remote history of GERD but does not report much problem with this.  Has remote history of smoking, approximately 20 pack years, but patient stopped smoking approximately 20 years ago  ROS: per HPI  Pertinent PMHx: Former smoker  Exam:  General: No acute distress Respiratory: Able to speak full sentences without issue Neuro: No facial asymmetry Psych: Normal mood and affect Skin: No rash on face  Assessment/Plan: Dysphagia with vomiting Also associated with weight loss.  Remote history of smoking.  Weight loss initially intentional, but seems to be aided by the ability to only take small meals at a time. Given this worsening, will place urgent referral to GI f for concern of possible esophageal or pharyngeal cancer.  Discussed potential importance with patient.  Time spent during visit with patient: 6 minutes

## 2019-02-07 ENCOUNTER — Encounter: Payer: Self-pay | Admitting: Gastroenterology

## 2019-02-07 ENCOUNTER — Ambulatory Visit (INDEPENDENT_AMBULATORY_CARE_PROVIDER_SITE_OTHER): Payer: Medicare Other | Admitting: Gastroenterology

## 2019-02-07 VITALS — BP 140/90 | HR 82 | Temp 97.3°F | Ht 69.0 in | Wt 197.0 lb

## 2019-02-07 DIAGNOSIS — K219 Gastro-esophageal reflux disease without esophagitis: Secondary | ICD-10-CM | POA: Diagnosis not present

## 2019-02-07 DIAGNOSIS — Z1212 Encounter for screening for malignant neoplasm of rectum: Secondary | ICD-10-CM | POA: Diagnosis not present

## 2019-02-07 DIAGNOSIS — Z1211 Encounter for screening for malignant neoplasm of colon: Secondary | ICD-10-CM | POA: Diagnosis not present

## 2019-02-07 DIAGNOSIS — R1310 Dysphagia, unspecified: Secondary | ICD-10-CM | POA: Diagnosis not present

## 2019-02-07 NOTE — Progress Notes (Signed)
Agree with the assessment and plan as outlined by Jessica Zehr, PA-C. ? ?Jaleal Schliep, DO, FACG ? ?

## 2019-02-07 NOTE — Progress Notes (Signed)
02/07/2019 Laura Kaufman [id number] February 05, 1956   HISTORY OF PRESENT ILLNESS: This is a 63 year old female who is new to our office.  She has been referred here by her PCP, Dr. McDiarmid, for evaluation regarding complaints of dysphagia.  She tells me that for about a week she was having issues with what seemed to be dysphagia and then vomiting/regurgitating the food that she just swallowed.  She says that she started drinking water, eating slower, avoiding caffeine, wearing looser pants/clothing, and since then her symptoms have completely resolved.  She says that she has not had any issues with that in about 2 weeks now.  She still occasionally gets reflux and just uses sodium bicarbonate tablets as needed.  She says that she has only needed to use these about 2 or 3 times in the past 2 weeks or so.  She has lost weight but tells me that it is intentional.  She tells me that her last colonoscopy was years ago, maybe 2007.  She says she is not having any problems and does not prefer to do a colonoscopy at this time.   History reviewed. No pertinent past medical history. Past Surgical History:  Procedure Laterality Date  . ABDOMINAL HYSTERECTOMY    . KNEE SURGERY    . WRIST SURGERY      reports that she has never smoked. She has never used smokeless tobacco. She reports current alcohol use. She reports that she does not use drugs. family history is not on file. Allergies  Allergen Reactions  . Bee Venom Anaphylaxis  . Penicillins Anaphylaxis and Shortness Of Breath          Outpatient Encounter Medications as of 02/07/2019  Medication Sig  . sodium bicarbonate 650 MG tablet Take 650 mg by mouth as needed for heartburn.  . [DISCONTINUED] fluticasone (FLONASE) 50 MCG/ACT nasal spray Place 2 sprays into both nostrils daily.   No facility-administered encounter medications on file as of 02/07/2019.      REVIEW OF SYSTEMS  : All other systems reviewed and negative except where  noted in the History of Present Illness.   PHYSICAL EXAM: BP 140/90   Pulse 82   Temp (!) 97.3 F (36.3 C)   Ht 5\' 9"  (1.753 m)   Wt 197 lb (89.4 kg)   BMI 29.09 kg/m  General: Well developed white female in no acute distress Head: Normocephalic and atraumatic Eyes:  Sclerae anicteric, conjunctiva pink. Ears: Normal auditory acuity Lungs: Clear throughout to auscultation; no increased WOB. Heart: Regular rate and rhythm; no M/R/G. Abdomen: Soft, non-distended.  BS present.  Non-tender. Musculoskeletal: Symmetrical with no gross deformities  Skin: No lesions on visible extremities Extremities: No edema  Neurological: Alert oriented x 4, grossly non-focal Psychological:  Alert and cooperative. Normal mood and affect  ASSESSMENT AND PLAN: *GERD and dysphagia symptoms completely resolved with some dietary and lifestyle modifications.  Currently only requiring sodium bicarbonate tablets 2 or 3 times over the course of 2 weeks.  Has absolutely had no dysphagia symptoms for the past 2 weeks.  I offered endoscopy and she declined for now.  She was advised that if symptoms return then she needs to make [place] aware so that we can possibly proceed with EGD and may need regular acid suppression medication. *CRC screening: Colonoscopy several years ago, maybe 2007.  Patient declined another colonoscopy for now, but says that she will perform a Cologuard and if that is positive then she be willing to do colonoscopy.  Will order.   CC:  McDiarmid, Blane Ohara, MD

## 2019-02-07 NOTE — Patient Instructions (Signed)
If you are age 65 or older, your body mass index should be between 23-30. Your Body mass index is 29.09 kg/m. If this is out of the aforementioned range listed, please consider follow up with your Primary Care Provider.  If you are age 4 or younger, your body mass index should be between 19-25. Your Body mass index is 29.09 kg/m. If this is out of the aformentioned range listed, please consider follow up with your Primary Care Provider.   Your provider has ordered Cologuard testing as an option for colon cancer screening. This is performed by [employer] and may be out of network with your insurance. PRIOR to completing the test, it is YOUR responsibility to contact your insurance about covered benefits for this test. Your out of pocket expense could be anywhere from $0.00 to $649.00.   When you call to check coverage with your insurer, please provide the following information:   -The ONLY provider of Cologuard is [place] code for Cologuard is [phone].  [employer] NPI # [id number]  -[employer] Tax ID # [id number]   We have already sent your demographic and insurance information to [employer] (phone number [phone]) and they should contact you within the next week regarding your test. If you have not heard from them within the next week, please call our office at [phone].  Please follow up with our office for any recurrent reflux or swallowing issues.  Thank you for choosing me and [facility]

## 2019-02-25 ENCOUNTER — Telehealth: Payer: Self-pay

## 2019-02-25 NOTE — Telephone Encounter (Signed)
Incoming fa from [employer], RE : Cologuard. Stool sample exceeds the allowable weight. The patient will be contacted by [employer] to resubmit a sample.

## 2019-03-11 DIAGNOSIS — Z1211 Encounter for screening for malignant neoplasm of colon: Secondary | ICD-10-CM | POA: Diagnosis not present

## 2019-03-11 DIAGNOSIS — Z1212 Encounter for screening for malignant neoplasm of rectum: Secondary | ICD-10-CM | POA: Diagnosis not present

## 2019-03-18 ENCOUNTER — Telehealth: Payer: Self-pay | Admitting: Gastroenterology

## 2019-03-18 ENCOUNTER — Other Ambulatory Visit: Payer: Self-pay

## 2019-03-18 LAB — COLOGUARD: Cologuard: POSITIVE — AB

## 2019-03-18 NOTE — Telephone Encounter (Signed)
No one has brought anything to me regarding results.  Check with whoever was with her on 11/19.  Looks like maybe [place].  She ordered the test so she may have the result.

## 2019-03-19 NOTE — Telephone Encounter (Signed)
Spoke with Magdalene River, CMA regarding this patients cologuard test. We will wait for direction from Alonza Bogus, PA-C

## 2019-03-20 ENCOUNTER — Encounter: Payer: Self-pay | Admitting: Gastroenterology

## 2019-03-28 ENCOUNTER — Ambulatory Visit (AMBULATORY_SURGERY_CENTER): Payer: Medicare Other | Admitting: *Deleted

## 2019-03-28 ENCOUNTER — Other Ambulatory Visit: Payer: Self-pay

## 2019-03-28 VITALS — Temp 98.1°F | Ht 69.0 in | Wt 197.8 lb

## 2019-03-28 DIAGNOSIS — R195 Other fecal abnormalities: Secondary | ICD-10-CM

## 2019-03-28 DIAGNOSIS — Z1159 Encounter for screening for other viral diseases: Secondary | ICD-10-CM

## 2019-03-28 MED ORDER — SUPREP BOWEL PREP KIT 17.5-3.13-1.6 GM/177ML PO SOLN: ORAL | 0 refills | Status: DC

## 2019-03-28 NOTE — Progress Notes (Signed)
Care partner's temperature 97.7  Pt is aware that care partner will wait in the car during procedure; if they feel like they will be too hot or cold to wait in the car; they may wait in the 4 th floor lobby. Patient is aware to bring only one care partner. We want them to wear a mask (we do not have any that we can provide them), practice social distancing, and we will check their temperatures when they get here.  I did remind the patient that their care partner needs to stay in the parking lot the entire time and have a cell phone available, we will call them when the pt is ready for discharge. Patient will wear mask into building.  covid test 04-12-19 at 1015  No egg or soy allergy  No home oxygen use or problems with anesthesia  No medications for weight loss taken  emmi information given

## 2019-04-03 ENCOUNTER — Encounter: Payer: Self-pay | Admitting: Gastroenterology

## 2019-04-08 ENCOUNTER — Telehealth: Payer: Self-pay

## 2019-04-08 NOTE — Telephone Encounter (Signed)
Pt calls nurse line with questions regarding history of vomiting, fatigue and weight loss over the last 3 months. Per patient, she has not been vomiting in the last month, however, she continues to have extreme fatigue and has lost approx 25 pounds in the last three months. Patient voiced concerns of having issues with thyroid, as she has a family history of thyroid issues. Patient has not been seen for office visit in over a year. Scheduled patient office visit on 04/24/19. Pt asked about blood work for thyroid testing. Informed patient that I would send message to PCP regarding blood work.   Please advise.  To PCP   Talbot Grumbling, RN

## 2019-04-08 NOTE — Telephone Encounter (Signed)
I agree patient needs to be seen. Can move appointment sooner in [facility] clinic or with another provider if desired by patient or worsening symptoms. Blood work can be discussed at appointment as we will need more information to determine which tests to order  Caroline More, DO, PGY-3 [facility] 04/08/2019 7:49 PM

## 2019-04-12 ENCOUNTER — Other Ambulatory Visit: Payer: Self-pay | Admitting: Gastroenterology

## 2019-04-12 ENCOUNTER — Ambulatory Visit (INDEPENDENT_AMBULATORY_CARE_PROVIDER_SITE_OTHER): Payer: Medicare Other

## 2019-04-12 DIAGNOSIS — Z1159 Encounter for screening for other viral diseases: Secondary | ICD-10-CM

## 2019-04-15 LAB — SARS CORONAVIRUS 2 (TAT 6-24 HRS): SARS Coronavirus 2: NEGATIVE

## 2019-04-17 ENCOUNTER — Ambulatory Visit (AMBULATORY_SURGERY_CENTER): Payer: Medicare Other | Admitting: Gastroenterology

## 2019-04-17 ENCOUNTER — Encounter: Payer: Self-pay | Admitting: Gastroenterology

## 2019-04-17 ENCOUNTER — Other Ambulatory Visit: Payer: Self-pay

## 2019-04-17 VITALS — BP 126/84 | HR 80 | Temp 96.9°F | Resp 17 | Ht 69.0 in | Wt 197.8 lb

## 2019-04-17 DIAGNOSIS — K5730 Diverticulosis of large intestine without perforation or abscess without bleeding: Secondary | ICD-10-CM

## 2019-04-17 DIAGNOSIS — D125 Benign neoplasm of sigmoid colon: Secondary | ICD-10-CM

## 2019-04-17 DIAGNOSIS — D123 Benign neoplasm of transverse colon: Secondary | ICD-10-CM

## 2019-04-17 DIAGNOSIS — D120 Benign neoplasm of cecum: Secondary | ICD-10-CM | POA: Diagnosis not present

## 2019-04-17 DIAGNOSIS — D124 Benign neoplasm of descending colon: Secondary | ICD-10-CM | POA: Diagnosis not present

## 2019-04-17 DIAGNOSIS — D122 Benign neoplasm of ascending colon: Secondary | ICD-10-CM

## 2019-04-17 DIAGNOSIS — R195 Other fecal abnormalities: Secondary | ICD-10-CM

## 2019-04-17 HISTORY — PX: COLONOSCOPY: SHX174

## 2019-04-17 MED ORDER — SODIUM CHLORIDE 0.9 % IV SOLN: 500.0000 mL | Freq: Once | INTRAVENOUS | Status: DC

## 2019-04-17 NOTE — Patient Instructions (Addendum)
Handouts provided on polyps and diverticulosis and high-fiber diet.  Repeat Colonoscopy in 1 year for surveillance.   YOU HAD AN ENDOSCOPIC PROCEDURE TODAY AT [facility]:   Refer to the procedure report that was given to you for any specific questions about what was found during the examination.  If the procedure report does not answer your questions, please call your gastroenterologist to clarify.  If you requested that your care partner not be given the details of your procedure findings, then the procedure report has been included in a sealed envelope for you to review at your convenience later.  YOU SHOULD EXPECT: Some feelings of bloating in the abdomen. Passage of more gas than usual.  Walking can help get rid of the air that was put into your GI tract during the procedure and reduce the bloating. If you had a lower endoscopy (such as a colonoscopy or flexible sigmoidoscopy) you may notice spotting of blood in your stool or on the toilet paper. If you underwent a bowel prep for your procedure, you may not have a normal bowel movement for a few days.  Please Note:  You might notice some irritation and congestion in your nose or some drainage.  This is from the oxygen used during your procedure.  There is no need for concern and it should clear up in a day or so.  SYMPTOMS TO REPORT IMMEDIATELY:   Following lower endoscopy (colonoscopy or flexible sigmoidoscopy):  Excessive amounts of blood in the stool  Significant tenderness or worsening of abdominal pains  Swelling of the abdomen that is new, acute  Fever of 100F or higher  For urgent or emergent issues, a gastroenterologist can be reached at any hour by calling [phone].   DIET:  We do recommend a small meal at first, but then you may proceed to your regular diet.  Drink plenty of fluids but you should avoid alcoholic beverages for 24 hours.  ACTIVITY:  You should plan to take it easy for the rest of today and  you should NOT DRIVE or use heavy machinery until tomorrow (because of the sedation medicines used during the test).    FOLLOW UP: Our staff will call the number listed on your records 48-72 hours following your procedure to check on you and address any questions or concerns that you may have regarding the information given to you following your procedure. If we do not reach you, we will leave a message.  We will attempt to reach you two times.  During this call, we will ask if you have developed any symptoms of COVID 19. If you develop any symptoms (ie: fever, flu-like symptoms, shortness of breath, cough etc.) before then, please call [phone].  If you test positive for Covid 19 in the 2 weeks post procedure, please call and report this information to [place].    If any biopsies were taken you will be contacted by phone or by letter within the next 1-3 weeks.  Please call us at [phone] if you have not heard about the biopsies in 3 weeks.    SIGNATURES/CONFIDENTIALITY: You and/or your care partner have signed paperwork which will be entered into your electronic medical record.  These signatures attest to the fact that that the information above on your After Visit Summary has been reviewed and is understood.  Full responsibility of the confidentiality of this discharge information lies with you and/or your care-partner.

## 2019-04-17 NOTE — Progress Notes (Signed)
Called to room to assist during endoscopic procedure.  Patient ID and intended procedure confirmed with present staff. Received instructions for my participation in the procedure from the performing physician.  

## 2019-04-17 NOTE — Progress Notes (Signed)
To PACU, VSS. Report to RN.tb 

## 2019-04-17 NOTE — Progress Notes (Signed)
Pt's states no medical or surgical changes since previsit or office visit. 

## 2019-04-17 NOTE — Op Note (Signed)
[facility] Patient Name: Marliese Holscher Procedure Date: 04/17/2019 9:07 AM MRN: [id number] Endoscopist: Gerrit Heck , MD Age: 64 Referring MD:  Date of Birth: 14-Mar-1956 Gender: Female Account #: [id number] Procedure:                Colonoscopy Indications:              This is the patient's first colonoscopy, Positive                            Cologuard test                           64 yo female presents for initial colonoscopy.                            Recent Cologuard positive in 02/2019. FHx notable                            for Mother with CRC at age 49. She is otherwise                            without active GI symptoms. Medicines:                Monitored Anesthesia Care Procedure:                Pre-Anesthesia Assessment:                           - Prior to the procedure, a History and Physical                            was performed, and patient medications and                            allergies were reviewed. The patient's tolerance of                            previous anesthesia was also reviewed. The risks                            and benefits of the procedure and the sedation                            options and risks were discussed with the patient.                            All questions were answered, and informed consent                            was obtained. Prior Anticoagulants: The patient has                            taken no previous anticoagulant or antiplatelet  agents. ASA Grade Assessment: II - A patient with                            mild systemic disease. After reviewing the risks                            and benefits, the patient was deemed in                            satisfactory condition to undergo the procedure.                           After obtaining informed consent, the colonoscope                            was passed under direct vision. Throughout the     procedure, the patient's blood pressure, pulse, and                            oxygen saturations were monitored continuously. The                            Colonoscope was introduced through the anus and                            advanced to the the cecum, identified by                            appendiceal orifice and ileocecal valve. The                            colonoscopy was performed without difficulty. The                            patient tolerated the procedure well. The quality                            of the bowel preparation was adequate. The                            ileocecal valve, appendiceal orifice, and rectum                            were photographed. Scope In: 9:12:23 AM Scope Out: 10:07:00 AM Scope Withdrawal Time: 0 hours 41 minutes 19 seconds  Total Procedure Duration: 0 hours 54 minutes 37 seconds  Findings:                 The perianal and digital rectal examinations were                            normal.                           Nine sessile polyps were found in the sigmoid colon                            (  4), descending colon (2), and transverse colon.                            (3) The polyps were 3 to 8 mm in size. These polyps                            were removed with a cold snare. Resection and                            retrieval were complete. Estimated blood loss was                            minimal.                           Six sessile polyps were found in the ascending                            colon (4) and cecum (2). Several with adherent                            mucus caps. The polyps were 4 to 12 mm in size.                            These polyps were removed with a cold snare.                            Resection and retrieval were complete. Estimated                            blood loss was minimal.                           Another 15 mm polyp was found in the cecum with                            adherent mucus cap.  The polyp was sessile. The                            polyp was removed with a hot snare. Resection and                            retrieval were complete. To prevent bleeding after                            the polypectomy, one hemostatic clip was                            successfully placed. There was no bleeding at the                            end of the procedure. The polyp was placed in a  container with the other cecal polyps/ascending                            colon polyps. Estimated blood loss was minimal.                           A few small-mouthed diverticula were found in the                            sigmoid colon.                           The retroflexed view of the distal rectum and anal                            verge was normal and showed no anal or rectal                            abnormalities.                           There was a small lipoma, in the ascending colon. Complications:            No immediate complications. Estimated Blood Loss:     Estimated blood loss was minimal. Impression:               - Nine 3 to 8 mm polyps in the sigmoid colon, in                            the descending colon and in the transverse colon,                            removed with a cold snare. Resected and retrieved.                           - Six 4 to 12 mm polyps in the ascending colon and                            in the cecum, removed with a cold snare. Resected                            and retrieved.                           - One 15 mm polyp in the cecum, removed with a hot                            snare. Resected and retrieved. Clip was placed.                           - Diverticulosis in the sigmoid colon.                           - The distal rectum and anal  verge are normal on                            retroflexion view.                           - Small lipoma in the ascending colon. Recommendation:           - Patient has  a contact number available for                            emergencies. The signs and symptoms of potential                            delayed complications were discussed with the                            patient. Return to normal activities tomorrow.                            Written discharge instructions were provided to the                            patient.                           - Resume previous diet.                           - Continue present medications.                           - Await pathology results.                           - Repeat colonoscopy in 1 year for surveillance.                           - Return to GI office PRN. Gerrit Heck, MD 04/17/2019 10:29:41 AM

## 2019-04-19 ENCOUNTER — Telehealth: Payer: Self-pay

## 2019-04-19 NOTE — Telephone Encounter (Signed)
  Follow up Call-  Call back number 04/17/2019  Post procedure Call Back phone  # [phone]  Permission to leave phone message Yes  Some recent data might be hidden     Patient questions:  Do you have a fever, pain , or abdominal swelling? No. Pain Score  0 *  Have you tolerated food without any problems? Yes.    Have you been able to return to your normal activities? Yes.    Do you have any questions about your discharge instructions: Diet   No. Medications  No. Follow up visit  No.  Do you have questions or concerns about your Care? No.  Actions: * If pain score is 4 or above: 1. No action needed, pain <4.Have you developed a fever since your procedure? no  2.   Have you had an respiratory symptoms (SOB or cough) since your procedure? no  3.   Have you tested positive for COVID 19 since your procedure no  4.   Have you had any family members/close contacts diagnosed with the COVID 19 since your procedure?  no   If yes to any of these questions please route to Joylene John, RN and Alphonsa Gin, Therapist, sports.

## 2019-04-19 NOTE — Telephone Encounter (Signed)
No answer, left message to call back later today, B.Tyona Nilsen RN. 

## 2019-04-23 ENCOUNTER — Telehealth: Payer: Self-pay | Admitting: *Deleted

## 2019-04-23 NOTE — Progress Notes (Signed)
Subjective:  CC -- Annual Physical; With complaints of fatigue and skin concerns  Pt reports she fatigued and has dry skin  Fatigue Patient reports feeling very tired.  Mother had a thyroid disorder which is why she is worried.  Does report weight fluctuation and recent weight loss.  Hair thinning as well.  Does report skin changes.  Denies any edema, fevers, chills.  Does report some emesis earlier but this is stopped.  Dry skin Patient reports very dry skin in her hands, eyes, back of her ears.  States that the area is dry and itchy.  Her hairdresser when she got her haircut last week told her she has psoriasis of the scalp.  The symptoms have been going on for the past year.  Worse when she is using hand sanitizer and in the winter.   General Healthcare: Medication Compliance: yes, not on prescription meds  Cardiac Risk as of 04/24/19(date): The ASCVD Risk score Mikey Bussing DC Jr., et al., 2013) failed to calculate for the following reasons:   Cannot find a previous HDL lab   Cannot find a previous total cholesterol lab Aspirin: no  Dx Hypertension: no  Dx Hyperlipidemia: no  Diabetes: no   Dx Obesity: yes; class 1 (BMI 28.91) Weight Loss: yes   Physical Activity: yes, walks at least 5 times/week  Urinary Incontinence: no    Social: Driving: yes   Alcohol Use: yes, occasional  Tobacco Use: no, quit 20+ years ago. previously smoked 1ppd x25 years   - Interested in [place]: N/A Other Drugs: no  Independence: yes Depression: no   - PHQ2 score: 0   Office Visit from 04/24/2019 in [facility]  PHQ-2 Total Score  0    Support and Life at Home: yes  Advanced Directives: yes   Cancer:  Colorectal >> Colonoscopy: yes  Findings:  - Nine 3 to 8 mm polyps in the sigmoid colon, in the descending colon and in the transverse colon, removed with a cold snare. Resected and retrieved. - Six 4 to 12 mm polyps in the ascending colon and in the cecum, removed with a cold  snare. Resected and retrieved. - One 15 mm polyp in the cecum, removed with a hot snare. Resected and retrieved. Clip was placed. - Diverticulosis in the sigmoid colon. - The distal rectum and anal verge are normal on retroflexion view. - Small lipoma in the ascending colon. Recommendations: - Patient has a contact number available for emergencies. The signs and symptoms of potential delayed complications were discussed with the patient. Return to normal activities tomorrow. Written discharge instructions were provided to the patient. - Resume previous diet. - Continue present medications. - Await pathology results. - Repeat colonoscopy in 1 year for surveillance. - Return to GI office PRN. Path results: 1. Surgical [P], colon, sigmoid, transverse, descending, polyp (9) - TUBULAR ADENOMA (1 FRAGMENT) - MULTIPLE FRAGMENTS OF HYPERPLASTIC POLYP(S) - NO HIGH GRADE DYSPLASIA OR MALIGNANCY IDENTIFIED 2. Surgical [P], colon, cecum, ascending, polyp (7) - TUBULAR ADENOMA (3 FRAGMENTS) - MULTIPLE FRAGMENTS OF SESSILE SERRATED POLYP(S) - NO HIGH GRADE DYSPLASIA OR MALIGNANCY IDENTIFIED Lung >> Tobacco Use: no   - If so, previous Low-Dose CT screen: no  Breast >> Mammogram: due, referral placed today. Patient will call and schedule   Cervical/Endometrial >>  - Postmenopausal: yes - Hysterectomy: yes  - Vaginal Bleeding: no - Pap Smear: N/A. Patient reports she does not have a cervix.  Reports that she had a total  hysterectomy over 30 years ago.  Refuses help with exam today to ensure she does not have a cervix.  States that she will call her OB/GYN to obtain medical records from [place] to show she had a total hysterectomy.  Hysterectomy was due to fibroid pain not cancer  - Previous Abnormal Pap: no  Skin >> Suspicious lesions: no   Other: Osteoporosis: no   Flu Vaccine: yes, obtained today  Pneumonia Vaccine: yes, obtained today    Past Medical History Patient Active Problem List    Diagnosis Date Noted  . Fatigue 04/24/2019  . Psoriasis 04/24/2019  . Atopic dermatitis 04/24/2019  . Gastroesophageal reflux disease 02/07/2019  . Dysphagia 02/07/2019  . Healthcare maintenance 03/03/2015  . Family history of hyperthyroidism 03/03/2015  . Fall from slip, trip, or stumble   . Lumbar compression fracture ([place])   . L1 vertebral fracture ([place]) 12/18/2014  . Back pain 12/18/2014    Medications- reviewed and updated Current Outpatient Medications  Medication Sig Dispense Refill  . fluocinonide (LIDEX) 0.05 % external solution Apply 1 application topically daily for 14 days. 840 mL 0  . sodium bicarbonate 650 MG tablet Take 650 mg by mouth as needed for heartburn.    . Turmeric 500 MG TABS Take by mouth daily.     No current facility-administered medications for this visit.    Objective: BP (!) 144/82   Pulse 84   Wt 195 lb 12.8 oz (88.8 kg)   SpO2 96%   BMI 28.91 kg/m  Gen: NAD, alert, cooperative with exam HEENT: NCAT, EOMI, PERRL CV: RRR, good S1/S2, no murmur Resp: CTABL, no wheezes, non-labored Abd: Soft, Non Tender, Non Distended, BS present, no guarding or organomegaly Genital Exam: exam declined by the patient Ext: No edema, warm Neuro: Alert and oriented, No gross deficits Skin: Dry nonerythematous patch on right palm in the center.  No flaking of that area.  Scaled patch throughout scalp and base of neck on erythematous base.  No pain to palpation.   Assessment/Plan:  Fatigue Patient with increased fatigue as well as hair and skin changes.  Can consider thyroid etiology especially since patient has a family history of this.  Will obtain TSH as well as T4.  Also reports some weight fluctuation as well.  Can also consider anemia so will obtain CBC.  Can also consider metabolic pathology so we will obtain CMP.  Also obtain HIV given weight change.  Advised to follow-up in 1 month, sooner if worsening.  Patient remains very active which is good.  I  encouraged this daily exercise.  Encouraged healthy diet.  Psoriasis Patient with psoriasis of the scalp and base of neck.  We will plan to give fluocinonide solution to be used daily x14 days.  Advised to follow-up if no improvement, sooner if worsening.  Likely worsening recently due to cold weather.  Atopic dermatitis Area of irritation in the hand likely secondary to recent increase in his hands and posterior use as well as cold weather.  Patient already having fluocinonide solution for scalp psoriasis and this will work for her hand irritation as well.  Advised to use daily for 14 days.  Strict return precautions given.  Follow-up if no improvement, sooner if worsening  Healthcare maintenance Patient reports no cervix as she had a total hysterectomy.  I informed her we could do a pelvic exam today to ensure she has no cervix since there are no medical records supporting this.  She refused at this  time but stated she will send her OB/GYN records to show that she had a total hysterectomy.  If unable to obtain medical records or medical records reports that she still has a cervix will need to have pelvic exam and possible Pap smear if cervix is present.  Received flu vaccine and pneumonia vaccine today.  Will need to follow-up for second course of pneumonia vaccine.    DEXA scan ordered as well as mammogram.  Patient will call breast center to have these both on the same day.  Colonoscopy is up-to-date   Orders Placed This Encounter  Procedures  . MM Digital Screening    Standing Status:   Future    Standing Expiration Date:   06/20/2020    Order Specific Question:   Reason for Exam (SYMPTOM  OR DIAGNOSIS REQUIRED)    Answer:   screening    Order Specific Question:   Preferred imaging location?    Answer:   [facility]  . DG Bone Density    Standing Status:   Future    Standing Expiration Date:   06/20/2020    Order Specific Question:   Reason for Exam (SYMPTOM  OR DIAGNOSIS  REQUIRED)    Answer:   screening    Order Specific Question:   Preferred imaging location?    Answer:   [facility]  . Pneumococcal conjugate vaccine 13-valent  . Flu Vaccine QUAD 36+ mos IM  . Comprehensive metabolic panel    Order Specific Question:   Has the patient fasted?    Answer:   No  . CBC  . Lipid Panel    Order Specific Question:   Has the patient fasted?    Answer:   No  . HIV antibody (with reflex)  . TSH + free T4    Meds ordered this encounter  Medications  . fluocinonide (LIDEX) 0.05 % external solution    Sig: Apply 1 application topically daily for 14 days.    Dispense:  840 mL    Refill:  0     Caroline More, DO, PGY-3 04/24/2019 9:59 AM

## 2019-04-23 NOTE — Telephone Encounter (Addendum)
LVM to call office to go over screening questions prior to visit tomorrow.Harryette Shuart Zimmerman Rumple, CMA  

## 2019-04-24 ENCOUNTER — Ambulatory Visit (INDEPENDENT_AMBULATORY_CARE_PROVIDER_SITE_OTHER): Payer: Medicare Other | Admitting: Family Medicine

## 2019-04-24 ENCOUNTER — Encounter: Payer: Self-pay | Admitting: Family Medicine

## 2019-04-24 ENCOUNTER — Other Ambulatory Visit: Payer: Self-pay

## 2019-04-24 ENCOUNTER — Encounter: Payer: Self-pay | Admitting: Gastroenterology

## 2019-04-24 VITALS — BP 144/82 | HR 84 | Wt 195.8 lb

## 2019-04-24 DIAGNOSIS — Z23 Encounter for immunization: Secondary | ICD-10-CM

## 2019-04-24 DIAGNOSIS — L2089 Other atopic dermatitis: Secondary | ICD-10-CM

## 2019-04-24 DIAGNOSIS — Z0000 Encounter for general adult medical examination without abnormal findings: Secondary | ICD-10-CM

## 2019-04-24 DIAGNOSIS — L209 Atopic dermatitis, unspecified: Secondary | ICD-10-CM | POA: Insufficient documentation

## 2019-04-24 DIAGNOSIS — E669 Obesity, unspecified: Secondary | ICD-10-CM

## 2019-04-24 DIAGNOSIS — R5383 Other fatigue: Secondary | ICD-10-CM | POA: Diagnosis not present

## 2019-04-24 DIAGNOSIS — E2839 Other primary ovarian failure: Secondary | ICD-10-CM

## 2019-04-24 DIAGNOSIS — Z1231 Encounter for screening mammogram for malignant neoplasm of breast: Secondary | ICD-10-CM

## 2019-04-24 DIAGNOSIS — L409 Psoriasis, unspecified: Secondary | ICD-10-CM

## 2019-04-24 DIAGNOSIS — R634 Abnormal weight loss: Secondary | ICD-10-CM | POA: Diagnosis not present

## 2019-04-24 MED ORDER — FLUOCINONIDE 0.05 % EX SOLN: 1.0000 "application " | Freq: Every day | CUTANEOUS | 0 refills | Status: AC

## 2019-04-24 NOTE — Patient Instructions (Addendum)
Psoriasis Psoriasis is a long-term (chronic) skin condition. It occurs because your body's defense system (immune system) causes skin cells to form too quickly. This causes raised, red patches (plaques) on your skin that look silvery. The patches may be on all areas of your body. They can be any size or shape. Psoriasis can come and go. It can range from mild to very bad. It cannot be passed from one person to another (is not contagious). There is no cure for this condition, but it can be helped with treatment. What are the causes? The cause of psoriasis is not known. Some things can make it worse. These are:  Skin damage, such as cuts, scrapes, sunburn, and dryness.  Not getting enough sunlight.  Some medicines.  Alcohol.  Tobacco.  Stress.  Infections. What increases the risk?  Having a family member with psoriasis.  Being very overweight (obese).  Being 20-40 years old.  Taking certain medicines. What are the signs or symptoms? There are different types of psoriasis. The types are:  Plaque. This is the most common. Symptoms include red, raised patches with a silvery coating. These may be itchy. Your nails may be crumbly or fall off.  Guttate. Symptoms include small red spots on your stomach area, arms, and legs. These may happen after you have been sick, such as with strep throat.  Inverse. Symptoms include patches in your armpits, under your breasts, private areas, or on your butt.  Pustular. Symptoms include pus-filled bumps on the palms of your hands or the soles of your feet. You also may feel very tired, weak, have a fever, and not be hungry.  Erythrodermic. Symptoms include bright red skin that looks burned. You may have a fast heartbeat and a body temperature that is too high or too low. You may be itchy or in pain.  Sebopsoriasis. Symptoms include red patches on your scalp, forehead, and face that are greasy.  Psoriatic arthritis. Symptoms include swollen,  painful joints along with scaly skin patches. How is this treated? There is no cure for this condition, but treatment can:  Help your skin heal.  Lessen itching and irritation and swelling (inflammation).  Slow the growth of new skin cells.  Help your body's defense system respond better to your skin. Treatment may include:  Creams or ointments.  Light therapy. This may include natural sunlight or light therapy in a doctor's office.  Medicines. These can help your body better manage skin cells. They may be used with light therapy or ointments. Medicines may include pills or injections. You may also get antibiotic medicines if you have an infection. Follow these instructions at home: Skin Care  Apply lotion to your skin as needed. Only use those that your doctor has said are okay.  Apply cool, wet cloths (cold compresses) to the affected areas.  Do not use a hot tub or take hot showers. Use slightly warm, not hot, water when taking showers and baths.  Do not scratch your skin. Lifestyle   Do not use any products that contain nicotine or tobacco, such as cigarettes, e-cigarettes, and chewing tobacco. If you need help quitting, ask your doctor.  Lower your stress.  Keep a healthy weight.  Go out in the sun as told by your doctor. Do not get sunburned.  Join a support group. Medicines  Take or use over-the-counter and prescription medicines only as told by your doctor.  If you were prescribed an antibiotic medicine, take it as told by your doctor.   Do not stop using the antibiotic even if you start to feel better. Alcohol use If you drink alcohol:  Limit how much you use: ? 0-1 drink a day for women. ? 0-2 drinks a day for men.  Be aware of how much alcohol is in your drink. In the [place] one drink equals one 12 oz bottle of beer (355 mL), one 5 oz glass of wine (148 mL), or one 1 oz glass of hard liquor (44 mL). General instructions  Keep a journal to track the  things that cause symptoms (triggers). Try to avoid these things.  See a counselor if you feel the support would help.  Keep all follow-up visits as told by your doctor. This is important. Contact a doctor if:  You have a fever.  Your pain gets worse.  You have more redness or warmth in the affected areas.  You have new or worse pain or stiffness in your joints.  Your nails start to break easily or pull away from the nail bed.  You feel very sad (depressed). Summary  Psoriasis is a long-term (chronic) skin condition.  There is no cure for this condition, but treatment can help manage it.  Keep a journal to track the things that cause symptoms.  Take or use over-the-counter and prescription medicines only as told by your doctor.  Keep all follow-up visits as told by your doctor. This is important. This information is not intended to replace advice given to you by your health care provider. Make sure you discuss any questions you have with your health care provider. Document Revised: 01/09/2018 Document Reviewed: 01/09/2018 Elsevier Patient Education  [place].   Use the steroid solution daily x14 days. If no improvement come back in.   Please have your OB/GYN send [place] records of your hysterectomy.  They are unable to send these records please come back in for pelvic exam so we can make sure you do not have a cervix  Please call the breast center to schedule both your mammogram and DEXA scan, they can be done on the same day.  We will call you with the results of your blood work.

## 2019-04-24 NOTE — Assessment & Plan Note (Signed)
Patient reports no cervix as she had a total hysterectomy.  I informed her we could do a pelvic exam today to ensure she has no cervix since there are no medical records supporting this.  She refused at this time but stated she will send her OB/GYN records to show that she had a total hysterectomy.  If unable to obtain medical records or medical records reports that she still has a cervix will need to have pelvic exam and possible Pap smear if cervix is present.  Received flu vaccine and pneumonia vaccine today.  Will need to follow-up for second course of pneumonia vaccine.    DEXA scan ordered as well as mammogram.  Patient will call breast center to have these both on the same day.  Colonoscopy is up-to-date

## 2019-04-24 NOTE — Assessment & Plan Note (Addendum)
Patient with increased fatigue as well as hair and skin changes.  Can consider thyroid etiology especially since patient has a family history of this.  Will obtain TSH as well as T4.  Also reports some weight fluctuation as well.  Can also consider anemia so will obtain CBC.  Can also consider metabolic pathology so we will obtain CMP.  Also obtain HIV given weight change.  Advised to follow-up in 1 month, sooner if worsening.  Patient remains very active which is good.  I encouraged this daily exercise.  Encouraged healthy diet.

## 2019-04-24 NOTE — Assessment & Plan Note (Signed)
Patient with psoriasis of the scalp and base of neck.  We will plan to give fluocinonide solution to be used daily x14 days.  Advised to follow-up if no improvement, sooner if worsening.  Likely worsening recently due to cold weather.

## 2019-04-24 NOTE — Assessment & Plan Note (Signed)
Area of irritation in the hand likely secondary to recent increase in his hands and posterior use as well as cold weather.  Patient already having fluocinonide solution for scalp psoriasis and this will work for her hand irritation as well.  Advised to use daily for 14 days.  Strict return precautions given.  Follow-up if no improvement, sooner if worsening

## 2019-04-25 LAB — CBC
Hematocrit: 48.5 % — ABNORMAL HIGH (ref 34.0–46.6)
Hemoglobin: 16.4 g/dL — ABNORMAL HIGH (ref 11.1–15.9)
MCH: 27.7 pg (ref 26.6–33.0)
MCHC: 33.8 g/dL (ref 31.5–35.7)
MCV: 82 fL (ref 79–97)
Platelets: 244 10*3/uL (ref 150–450)
RBC: 5.91 x10E6/uL — ABNORMAL HIGH (ref 3.77–5.28)
RDW: 12.8 % (ref 11.7–15.4)
WBC: 7.9 10*3/uL (ref 3.4–10.8)

## 2019-04-25 LAB — COMPREHENSIVE METABOLIC PANEL
ALT: 57 IU/L — ABNORMAL HIGH (ref 0–32)
AST: 40 IU/L (ref 0–40)
Albumin/Globulin Ratio: 1.5 (ref 1.2–2.2)
Albumin: 4.4 g/dL (ref 3.8–4.8)
Alkaline Phosphatase: 122 IU/L — ABNORMAL HIGH (ref 39–117)
BUN/Creatinine Ratio: 14 (ref 12–28)
BUN: 10 mg/dL (ref 8–27)
Bilirubin Total: 0.3 mg/dL (ref 0.0–1.2)
CO2: 20 mmol/L (ref 20–29)
Calcium: 9.8 mg/dL (ref 8.7–10.3)
Chloride: 105 mmol/L (ref 96–106)
Creatinine, Ser: 0.7 mg/dL (ref 0.57–1.00)
GFR calc Af Amer: 105 mL/min/{1.73_m2} (ref >59)
GFR calc non Af Amer: 91 mL/min/{1.73_m2} (ref >59)
Globulin, Total: 2.9 g/dL (ref 1.5–4.5)
Glucose: 102 mg/dL — ABNORMAL HIGH (ref 65–99)
Potassium: 4.6 mmol/L (ref 3.5–5.2)
Sodium: 141 mmol/L (ref 134–144)
Total Protein: 7.3 g/dL (ref 6.0–8.5)

## 2019-04-25 LAB — LIPID PANEL
Chol/HDL Ratio: 2.8 ratio (ref 0.0–4.4)
Cholesterol, Total: 132 mg/dL (ref 100–199)
HDL: 48 mg/dL (ref >39)
LDL Chol Calc (NIH): 64 mg/dL (ref 0–99)
Triglycerides: 110 mg/dL (ref 0–149)
VLDL Cholesterol Cal: 20 mg/dL (ref 5–40)

## 2019-04-25 LAB — HIV ANTIBODY (ROUTINE TESTING W REFLEX): HIV Screen 4th Generation wRfx: NONREACTIVE

## 2019-04-25 LAB — TSH+FREE T4
Free T4: 1.71 ng/dL (ref 0.82–1.77)
TSH: 2.47 u[IU]/mL (ref 0.450–4.500)

## 2019-05-12 ENCOUNTER — Ambulatory Visit: Payer: Medicare Other | Attending: Internal Medicine

## 2019-05-12 DIAGNOSIS — Z23 Encounter for immunization: Secondary | ICD-10-CM

## 2019-05-12 NOTE — Progress Notes (Signed)
   Covid-19 Vaccination Clinic  Name:  Laura Kaufman    MRN: [id number] DOB: [birth year]  05/12/2019  [name] was observed post Covid-19 immunization for 15 minutes without incidence. She was provided with Vaccine Information Sheet and instruction to access the V-Safe system.   [name] was instructed to call 911 with any severe reactions post vaccine: Marland Kitchen Difficulty breathing  . Swelling of your face and throat  . A fast heartbeat  . A bad rash all over your body  . Dizziness and weakness    Immunizations Administered    Name Date Dose VIS Date Route   Pfizer COVID-19 Vaccine 05/12/2019  9:36 AM 0.3 mL 03/01/2019 Intramuscular   Manufacturer: [place]   Lot: [id number]   [place]: [id number]

## 2019-06-05 ENCOUNTER — Ambulatory Visit: Payer: Medicare Other | Attending: Internal Medicine

## 2019-06-05 DIAGNOSIS — Z23 Encounter for immunization: Secondary | ICD-10-CM

## 2019-06-05 NOTE — Progress Notes (Signed)
   Covid-19 Vaccination Clinic  Name:  Laura Kaufman    MRN: [id number] DOB: 1955/09/11  06/05/2019  Ms. [name] was observed post Covid-19 immunization for 30 minutes based on pre-vaccination screening without incident. She was provided with Vaccine Information Sheet and instruction to access the V-Safe system.   Ms. [name] was instructed to call 911 with any severe reactions post vaccine: Marland Kitchen Difficulty breathing  . Swelling of face and throat  . A fast heartbeat  . A bad rash all over body  . Dizziness and weakness   Immunizations Administered    Name Date Dose VIS Date Route   Pfizer COVID-19 Vaccine 06/05/2019  9:44 AM 0.3 mL 03/01/2019 Intramuscular   Manufacturer: [place]   Lot: [id number]   [place]: [id number]

## 2019-07-15 ENCOUNTER — Other Ambulatory Visit: Payer: Self-pay

## 2019-07-15 ENCOUNTER — Ambulatory Visit
Admission: RE | Admit: 2019-07-15 | Discharge: 2019-07-15 | Disposition: A | Discharge status: Home / Self Care | Payer: Medicare Other | Source: Ambulatory Visit | Attending: Family Medicine | Admitting: Family Medicine

## 2019-07-15 DIAGNOSIS — M810 Age-related osteoporosis without current pathological fracture: Secondary | ICD-10-CM | POA: Diagnosis not present

## 2019-07-15 DIAGNOSIS — Z1231 Encounter for screening mammogram for malignant neoplasm of breast: Secondary | ICD-10-CM | POA: Diagnosis not present

## 2019-07-15 DIAGNOSIS — Z780 Asymptomatic menopausal state: Secondary | ICD-10-CM | POA: Diagnosis not present

## 2019-07-15 DIAGNOSIS — E2839 Other primary ovarian failure: Secondary | ICD-10-CM

## 2019-07-22 ENCOUNTER — Encounter: Payer: Self-pay | Admitting: Family Medicine

## 2019-07-22 ENCOUNTER — Other Ambulatory Visit: Payer: Self-pay

## 2019-07-22 ENCOUNTER — Ambulatory Visit (INDEPENDENT_AMBULATORY_CARE_PROVIDER_SITE_OTHER): Payer: Medicare Other | Admitting: Family Medicine

## 2019-07-22 DIAGNOSIS — M818 Other osteoporosis without current pathological fracture: Secondary | ICD-10-CM

## 2019-07-22 DIAGNOSIS — M810 Age-related osteoporosis without current pathological fracture: Secondary | ICD-10-CM | POA: Insufficient documentation

## 2019-07-22 MED ORDER — ALENDRONATE SODIUM 70 MG PO TABS: 70.0000 mg | ORAL_TABLET | ORAL | 0 refills | Status: DC

## 2019-07-22 NOTE — Assessment & Plan Note (Signed)
Patient with osteoporosis per DEXA scan and also history of lumbar vertebral fracture.  Patient will continue to use vitamin D and calcium supplementation.  Discussed bisphosphonate treatment with patient.  Risks and benefits discussed with patient.  She would like to start this.  Will use Fosamax.  Advised to make sure to drink water afterwards and sit upright.  Patient to follow-up in 6 months, sooner if worsening.  Advised strengthening exercises with patient and she will do these at home.

## 2019-07-22 NOTE — Patient Instructions (Signed)
Osteoporosis  Osteoporosis happens when your bones get thin and weak. This can cause your bones to break (fracture) more easily. You can do things at home to make your bones stronger. Follow these instructions at home:  Activity  Exercise as told by your doctor. Ask your doctor what activities are safe for you. You should do: ? Exercises that make your muscles work to hold your body weight up (weight-bearing exercises). These include tai chi, yoga, and walking. ? Exercises to make your muscles stronger. One example is lifting weights. Lifestyle  Limit alcohol intake to no more than 1 drink a day for nonpregnant women and 2 drinks a day for men. One drink equals 12 oz of beer, 5 oz of wine, or 1 oz of hard liquor.  Do not use any products that have nicotine or tobacco in them. These include cigarettes and e-cigarettes. If you need help quitting, ask your doctor. Preventing falls  Use tools to help you move around (mobility aids) as needed. These include canes, walkers, scooters, and crutches.  Keep rooms well-lit and free of clutter.  Put away things that could make you trip. These include cords and rugs.  Install safety rails on stairs. Install grab bars in bathrooms.  Use rubber mats in slippery areas, like bathrooms.  Wear shoes that: ? Fit you well. ? Support your feet. ? Have closed toes. ? Have rubber soles or low heels.  Tell your doctor about all of the medicines you are taking. Some medicines can make you more likely to fall. General instructions  Eat plenty of calcium and vitamin D. These nutrients are good for your bones. Good sources of calcium and vitamin D include: ? Some fatty fish, such as salmon and tuna. ? Foods that have calcium and vitamin D added to them (fortified foods). For example, some breakfast cereals are fortified with calcium and vitamin D. ? Egg yolks. ? Cheese. ? Liver.  Take over-the-counter and prescription medicines only as told by your  doctor.  Keep all follow-up visits as told by your doctor. This is important. Contact a doctor if:  You have not been tested (screened) for osteoporosis and you are: ? A woman who is age 71 or older. ? A man who is age 23 or older. Get help right away if:  You fall.  You get hurt. Summary  Osteoporosis happens when your bones get thin and weak.  Weak bones can break (fracture) more easily.  Eat plenty of calcium and vitamin D. These nutrients are good for your bones.  Tell your doctor about all of the medicines that you take. This information is not intended to replace advice given to you by your health care provider. Make sure you discuss any questions you have with your health care provider. Document Revised: 02/17/2017 Document Reviewed: 12/30/2016 Elsevier Patient Education  [place].  Alendronate; Cholecalciferol tablets What is this medicine? ALENDRONATE; CHOLECALCIFEROL (a LEN droe nate; KOL e cal SIF er ol) has two medicines to help reduce calcium loss from bones and to increase the production of normal, healthy bone in patients with osteoporosis. This medicine may be used for other purposes; ask your health care provider or pharmacist if you have questions. COMMON BRAND NAME(S): Fosamax Plus D What should I tell my health care provider before I take this medicine? They need to know if you have any of these conditions:  dental disease  kidney disease  low level or high level of blood calcium  problems  sitting or standing for 30 minutes  problems swallowing  stomach, intestine, or esophagus problems like acid reflux or GERD  vitamin D toxicity  an unusual or allergic reaction to alendronate, cholecalciferol, medicines, foods, lactose, dyes, or preservatives  pregnant or trying to get pregnant  breast-feeding How should I use this medicine? You must take this medicine exactly as directed or you will lower the amount of medicine you absorb into  your body or you may cause yourself harm. Take your dose by mouth first thing in the morning, after you are up for the day. Do not eat or drink anything before you take this medicine. Swallow your medicine with a full glass (6 to 8 fluid ounces) of plain water. Do not take this tablet with any with any other drink. Follow the directions on the prescription label. After taking this medicine, do not eat breakfast, drink, or take any medicines or vitamins for at least 30 minutes. Stand or sit up for at least 30 minutes after you take this medicine; do not lie down. Take the medicine on the same day every week. Do not take your medicine more often than directed. Do not stop taking except on your doctor's advice. Talk to your pediatrician regarding the use of this medicine in children. Special care may be needed. Overdosage: If you think you have taken too much of this medicine contact a poison control center or emergency room at once. NOTE: This medicine is only for you. Do not share this medicine with others. What if I miss a dose? If you miss a dose, take the dose on the morning after you remember. Take your next dose on your regular chosen day of the week, but do not take 2 tablets on the same day. Do not take double or extra doses. What may interact with this medicine?  antacids  aspirin and aspirin like drugs  calcium supplements, especially calcium with vitamin D  cholestyramine or colestipol  cimetidine, ranitidine, or other medicines used to decrease stomach acid  corticosteroids  iron supplements  magnesium supplements  mineral oil  NSAIDs, medicines for pain and inflammation, like ibuprofen or naproxen  orlistat  phenobarbital  phenytoin  phosphorous supplements  primidone  steroid medicines like prednisone or cortisone  thiazide diuretics  vitamins with minerals, especially with vitamin D This list may not describe all possible interactions. Give your health care  provider a list of all the medicines, herbs, non-prescription drugs, or dietary supplements you use. Also tell them if you smoke, drink alcohol, or use illegal drugs. Some items may interact with your medicine. What should I watch for while using this medicine? Visit your doctor or health care professional for regular checkups. It may be some time before you see the benefit from this medicine. Do not stop taking your medicine unless your doctor tells you to. Your doctor may order blood tests or other tests to see how you are doing. You should make sure that you get enough calcium and vitamin D while you are taking this medicine. Discuss the foods you eat and the vitamins you take with your health care professional. If you have pain when swallowing, difficulty swallowing, heartburn, or stomach pain, immediately call your doctor or health care professional. If you are taking an antacid, a mineral supplement like calcium or iron, or a vitamin with minerals, wait to take them at least 30 minutes after you take this medicine. Do not take them at same time. This medicine can make you  more sensitive to the sun. If you get a rash while taking this medicine, sunlight may cause the rash to get worse. Keep out of the sun. If you cannot avoid being in the sun, wear protective clothing and use sunscreen. Do not use sun lamps or tanning beds/booths. Some people who take this medicine have severe bone, joint, and/or muscle pain. This medicine may also increase your risk for a broken thigh bone. Tell your doctor right away if you have pain in your upper leg or groin. Tell your doctor if you have any pain that does not go away or that gets worse. What side effects may I notice from receiving this medicine? Side effects that you should report to your doctor or health care professional as soon as possible:  allergic reactions like skin rash, itching or hives, swelling of the face, lips, or tongue  bone, muscle, or joint  pain  changes in vision  heartburn or chest pain  pain or difficulty swallowing  redness, blistering, peeling or loosening of the skin, including inside the mouth  stomach pain  unusual bleeding or bruising  unusually weak or tired  vomiting Side effects that usually do not require medical attention (report to your doctor or health care professional if they continue or are bothersome):  diarrhea or constipation  headache  nausea  stomach gas or fullness This list may not describe all possible side effects. Call your doctor for medical advice about side effects. You may report side effects to FDA at 1-800-FDA-1088. Where should I keep my medicine? Keep out of the reach of children. Store at room temperature between 20 and 25 degrees C (68 and 77 degrees F). Protect the medicine from moisture and light. Throw away any unused medicine after the expiration date. NOTE: This sheet is a summary. It may not cover all possible information. If you have questions about this medicine, talk to your doctor, pharmacist, or health care provider.  2020 Elsevier/Gold Standard (2010-09-03 08:57:31)

## 2019-07-22 NOTE — Progress Notes (Signed)
    SUBJECTIVE:   CHIEF COMPLAINT / HPI:   Discuss DEXA scan Patient presenting today to discuss results of her DEXA scan.  DEXA scan performed on 4/26 showing T score of -3.1.  We discussed this via MyChart.  Patient has started taking vitamin D and calcium supplements 4 days ago.  Drinks milk daily.  No family history of osteoporosis.  Of note patient does have past medical history significant for L1 vertebral fracture as well as lumbar compression fracture.  States that she does break things very easily.  Patient is planning on starting an exercise routine including walking and using the pool.  Patient would like to start a medication to help with her osteoporosis.  PERTINENT  PMH / PSH: vertebral fracture  OBJECTIVE:   BP (!) 144/92   Pulse 82   Ht 5\' 9"  (1.753 m)   Wt 198 lb 6.4 oz (90 kg)   SpO2 97%   BMI 29.30 kg/m   Gen: awake and alert, NAD Cardio: RR Resp: speaking full sentences, no increased WOB Ext: no edema  ASSESSMENT/PLAN:   Osteoporosis Patient with osteoporosis per DEXA scan and also history of lumbar vertebral fracture.  Patient will continue to use vitamin D and calcium supplementation.  Discussed bisphosphonate treatment with patient.  Risks and benefits discussed with patient.  She would like to start this.  Will use Fosamax.  Advised to make sure to drink water afterwards and sit upright.  Patient to follow-up in 6 months, sooner if worsening.  Advised strengthening exercises with patient and she will do these at home.   Discussed with Dr. Vertell Limber, [place]

## 2020-01-18 ENCOUNTER — Ambulatory Visit: Payer: Medicare Other | Attending: Internal Medicine

## 2020-01-18 DIAGNOSIS — Z23 Encounter for immunization: Secondary | ICD-10-CM

## 2020-01-18 NOTE — Progress Notes (Signed)
   Covid-19 Vaccination Clinic  Name:  Laura Kaufman    MRN: [id number] DOB: [birth year]  01/18/2020  Laura Kaufman was observed post Covid-19 immunization for 15 minutes without incident. She was provided with Vaccine Information Sheet and instruction to access the V-Safe system.   Laura Kaufman was instructed to call 911 with any severe reactions post vaccine: Marland Kitchen Difficulty breathing  . Swelling of face and throat  . A fast heartbeat  . A bad rash all over body  . Dizziness and weakness

## 2020-01-23 NOTE — Telephone Encounter (Signed)
I have not seen this patient in a year.  Not sure what it means either.  Could contact her and ask her what she was trying to do.Marland KitchenMarland Kitchen

## 2020-01-31 ENCOUNTER — Other Ambulatory Visit: Payer: Self-pay

## 2020-01-31 ENCOUNTER — Encounter: Payer: Self-pay | Admitting: Family Medicine

## 2020-01-31 ENCOUNTER — Ambulatory Visit (INDEPENDENT_AMBULATORY_CARE_PROVIDER_SITE_OTHER): Payer: Medicare Other | Admitting: Family Medicine

## 2020-01-31 VITALS — BP 132/94 | HR 90 | Ht 69.0 in | Wt 203.2 lb

## 2020-01-31 DIAGNOSIS — L821 Other seborrheic keratosis: Secondary | ICD-10-CM

## 2020-01-31 DIAGNOSIS — Z23 Encounter for immunization: Secondary | ICD-10-CM | POA: Diagnosis not present

## 2020-01-31 NOTE — Patient Instructions (Signed)
It was wonderful to see you today.  Today you were seen for a seborrheic keratosis on your forehead. We performed cryotherapy. This lesion will scalp over. Let us know if it is not better in 2 weeks or if it returns.   Please call the clinic at [phone] if you have any concerns. It was our pleasure to serve you.  Dr. Janus Molder   Seborrheic Keratosis A seborrheic keratosis is a common, noncancerous (benign) skin growth. These growths are velvety, waxy, rough, tan, brown, or black spots that appear on the skin. These skin growths can be flat or raised, and scaly. What are the causes? The cause of this condition is not known. What increases the risk? You are more likely to develop this condition if you:  Have a family history of seborrheic keratosis.  Are 50 or older.  Are pregnant.  Have had estrogen replacement therapy. What are the signs or symptoms? Symptoms of this condition include growths on the face, chest, shoulders, back, or other areas. These growths:  Are usually painless, but may become irritated and itchy.  Can be yellow, brown, black, or other colors.  Are slightly raised or have a flat surface.  Are sometimes rough or wart-like in texture.  Are often velvety or waxy on the surface.  Are round or oval-shaped.  Often occur in groups, but may occur as a single growth. How is this diagnosed? This condition is diagnosed with a medical history and physical exam.  A sample of the growth may be tested (skin biopsy).  You may need to see a skin specialist (dermatologist). How is this treated? Treatment is not usually needed for this condition, unless the growths are irritated or bleed often.  You may also choose to have the growths removed if you do not like their appearance. ? Most commonly, these growths are treated with a procedure in which liquid nitrogen is applied to "freeze" off the growth (cryosurgery). ? They may also be burned off with  electricity (electrocautery) or removed by scraping (curettage). Follow these instructions at home:  Watch your growth for any changes.  Keep all follow-up visits as told by your health care provider. This is important.  Do not scratch or pick at the growth or growths. This can cause them to become irritated or infected. Contact a health care provider if:  You suddenly have many new growths.  Your growth bleeds, itches, or hurts.  Your growth suddenly becomes larger or changes color. Summary  A seborrheic keratosis is a common, noncancerous (benign) skin growth.  Treatment is not usually needed for this condition, unless the growths are irritated or bleed often.  Watch your growth for any changes.  Contact a health care provider if you suddenly have many new growths or your growth suddenly becomes larger or changes color.  Keep all follow-up visits as told by your health care provider. This is important. This information is not intended to replace advice given to you by your health care provider. Make sure you discuss any questions you have with your health care provider. Document Revised: 07/20/2017 Document Reviewed: 07/20/2017 Elsevier Patient Education  [place].

## 2020-01-31 NOTE — Progress Notes (Signed)
    SUBJECTIVE:   CHIEF COMPLAINT / HPI:   Laura Kaufman is a 64 yo F who presents for the issue below.   Skin Lesion Located on her forehead right at the scalp line. She noticed it 1 week prior. It is not bothering her other than appearance. She endorses wearing visors during the summer months but admits this part of her head may not be covered.   PERTINENT  PMH / PSH: Hx of psoriasis and atopic dermatitis  OBJECTIVE:   BP (!) 132/94   Pulse 90   Ht 5\' 9"  (1.753 m)   Wt 203 lb 3.2 oz (92.2 kg)   SpO2 96%   BMI 30.01 kg/m   General: Appears well, no acute distress. Age appropriate. Respiratory: normal effort Skin: Lesion on forehead at scalp line consistent with seborrheic keratosis benign findings.   PROCEDURE: Cryotherapy         The area surrounding the skin lesion was prepared in the usual sterile manner. A test freeze was performed ensuring coverage of entire area as above.  The cryotherapy swab was then applied for 3 seconds until an ice ball formed at the border and surrounding tissue.  This was allowed to thaw and then the cryotherapy was again applied repeatedly with cotton swab until entire lesion was covered.     The patient tolerated the procedure well.  Return precautions provided.  Return to the office if area on forehead does not heal completely as this may require biopsy.   ASSESSMENT/PLAN:   Seborrheic keratosis On the right side of the forehead near the scalp line. Otherwise unnoticeable due to patient hair position. Benign findings consistent with SK. Cryotherapy today. Patient instructed to follow up if it returns. Offered to be see in dermatology clinic and biopsy, patient chose cryotherapy.  -Follow up PRN  Need for immunization against influenza - Flu Vaccine QUAD High Dose(Fluad)  Gerlene Fee, DO [place]

## 2020-02-02 DIAGNOSIS — L821 Other seborrheic keratosis: Secondary | ICD-10-CM | POA: Insufficient documentation

## 2020-02-02 NOTE — Assessment & Plan Note (Signed)
On the right side of the forehead near the scalp line. Otherwise unnoticeable due to patient hair position. Benign findings consistent with SK. Cryotherapy today. Patient instructed to follow up if it returns. Offered to be see in dermatology clinic and biopsy, patient chose cryotherapy.  -Follow up PRN

## 2020-06-29 ENCOUNTER — Encounter: Payer: Self-pay | Admitting: Family Medicine

## 2020-06-29 ENCOUNTER — Telehealth: Payer: Self-pay

## 2020-06-29 ENCOUNTER — Other Ambulatory Visit: Payer: Self-pay

## 2020-06-29 ENCOUNTER — Ambulatory Visit (HOSPITAL_COMMUNITY)
Admission: RE | Admit: 2020-06-29 | Discharge: 2020-06-29 | Disposition: A | Discharge status: Home / Self Care | Payer: Medicare HMO | Source: Ambulatory Visit | Attending: Family Medicine | Admitting: Family Medicine

## 2020-06-29 ENCOUNTER — Ambulatory Visit (INDEPENDENT_AMBULATORY_CARE_PROVIDER_SITE_OTHER): Payer: Medicare HMO | Admitting: Family Medicine

## 2020-06-29 VITALS — BP 173/103 | HR 98 | Ht 69.0 in | Wt 197.8 lb

## 2020-06-29 DIAGNOSIS — R42 Dizziness and giddiness: Secondary | ICD-10-CM

## 2020-06-29 DIAGNOSIS — I10 Essential (primary) hypertension: Secondary | ICD-10-CM | POA: Insufficient documentation

## 2020-06-29 MED ORDER — LOSARTAN POTASSIUM 25 MG PO TABS: 25.0000 mg | ORAL_TABLET | Freq: Every day | ORAL | 0 refills | Status: DC

## 2020-06-29 NOTE — Assessment & Plan Note (Addendum)
New onset dizziness and sensation of  "woozy' over the last 2 weeks, worse when standing.  Differentials include uncontrolled hypertension, orthostatic hypotension etc. Low suspicion for cardiac causes such as arrthymias, neurological cause such as stroke, MS, space occupying lesion or inner ear abnormality such as BPPV, vestibular neuritis, Mnire's disease etc. BP elevated to 173/23 today, standing blood pressures 143/99, 151/101.  There is a orthostatic drop which could be contributing to symptoms however her blood pressure are still elevated with orthostasis.  EKG: SR. Used shared decision-making to treat patient's blood pressure today.  Started Cozaar 25 mg daily.  Also obtained BMP today.  Patient will have lab visit next week for BMP and f/u in 2 weeks with me. Pt appears very stressed today, recommended relaxation techniques such as meditation.

## 2020-06-29 NOTE — Assessment & Plan Note (Addendum)
Started on Cozaar 25mg  daily. See plan further above.

## 2020-06-29 NOTE — Patient Instructions (Addendum)
Thank you for coming to see me today. It was a pleasure. Today we discussed we discussed your high blood pressures. I think it is related to your high blood pressure. Please start cozaar 25mg  daily. I would also encourage you to slow down and try some relaxation exercises such as meditation, gardening etc. This will help with your blood pressure.   Keep a BP diary at home. If your blood pressures are still very high ie >160-170 in the next week then book a sooner follow up.   Please follow-up with me in 2 weeks   If you have any questions or concerns, please do not hesitate to call the office at [phone].  Best wishes,   Dr Posey Pronto

## 2020-06-29 NOTE — Telephone Encounter (Signed)
Patient calls nurse line stating she checked her blood pressure at home and had a high reading. Patient states she has been dizzy for about 2 weeks now. Patient reports good hydration. Patient is not currently on BP meds. Patient denies chest pain or SOB. Patient scheduled for this afternoon. ED precautions given between now and then.

## 2020-06-29 NOTE — Progress Notes (Signed)
     SUBJECTIVE:   CHIEF COMPLAINT / HPI:   Laura Kaufman is a 65 y.o. female presents for dizziness  Dizziness  Feeling "woozy" on standing for 14 days ago.  Initially she thought it was due to dehydration so she started drinking more water and drinking Pedialyte.  She feels like she is doing better now in terms of the symptoms but still feels dizzy at times. She is very stressed with house renovations.  Her house has been put on the market today.   Feels like room spins: no Lightheadedness when stands: yes Palpitations or heart racing: no  Prior dizziness: no  Medications tried: no  Taking blood thinners: no  Weight loss: No   Symptoms Hearing Loss: no Ear Pain or fullness: no  Nausea or vomiting: no  Vision difficulty or double vision: no  Falls: no  Head trauma: no  Weakness in arm or leg: no  Speaking problems: no  Headache: yes, thinks it is due to allergies   High blood pressure  Measures BP at home. It has been 170/115.  ROS see HPI Smoking Status noted  Health Maintenance Due  Topic  . COLONOSCOPY (Pts 45-77yrs Insurance coverage will need to be confirmed)   . PNA vac Low Risk Adult (2 of 2 - PPSV23)     PERTINENT  PMH / PSH: GERD, psoarisis, L1 fracture, atopic dermatitis   OBJECTIVE:   BP (!) 173/103   Pulse 98   Ht 5\' 9"  (1.753 m)   Wt 197 lb 12.8 oz (89.7 kg)   SpO2 96%   BMI 29.21 kg/m    General: Alert, no acute distress, well appearing, pleasant HEENT: NCAT, normal tympanic membranes bilaterally Cardio: Normal S1 and S2, RRR, no r/m/g Pulm: CTAB, normal work of breathing Abdomen: Bowel sounds normal. Abdomen soft and non-tender.  Extremities: No peripheral edema.  Neuro: Cranial nerve exam normal  ASSESSMENT/PLAN:   Dizziness New onset dizziness and sensation of  "woozy' over the last 2 weeks, worse when standing.  Differentials include uncontrolled hypertension, orthostatic hypotension etc. Low suspicion for neurological cause such  as stroke, MS, space occupying lesion or inner ear abnormality such as BPPV, vestibular neuritis, Mnire's disease etc. BP elevated to 173/23 today, standing blood pressures 143/99, 151/101.  There is a orthostatic drop which could be contributing to symptoms however her blood pressure are still elevated with orthostasis.  Used shared decision-making to treat patient's blood pressure today.  Started Cozaar 25 mg daily.  Also obtained BMP today.  Patient will have lab visit next week for BMP and f/u in 2 weeks with me. Pt appears very stressed today, recommended relaxation techniques such as meditation.  HTN (hypertension) Started on Cozaar 25mg  daily. See plan further above.     Lattie Haw, MD PGY-2 [place]

## 2020-06-30 ENCOUNTER — Other Ambulatory Visit: Payer: Self-pay | Admitting: Family Medicine

## 2020-06-30 ENCOUNTER — Encounter: Payer: Self-pay | Admitting: Family Medicine

## 2020-06-30 DIAGNOSIS — D751 Secondary polycythemia: Secondary | ICD-10-CM

## 2020-06-30 LAB — BASIC METABOLIC PANEL
BUN/Creatinine Ratio: 12 (ref 12–28)
BUN: 11 mg/dL (ref 8–27)
CO2: 18 mmol/L — ABNORMAL LOW (ref 20–29)
Calcium: 10.2 mg/dL (ref 8.7–10.3)
Chloride: 104 mmol/L (ref 96–106)
Creatinine, Ser: 0.89 mg/dL (ref 0.57–1.00)
Glucose: 102 mg/dL — ABNORMAL HIGH (ref 65–99)
Potassium: 4.1 mmol/L (ref 3.5–5.2)
Sodium: 143 mmol/L (ref 134–144)
eGFR: 71 mL/min/{1.73_m2} (ref >59)

## 2020-07-06 ENCOUNTER — Encounter: Payer: Self-pay | Admitting: Gastroenterology

## 2020-07-06 ENCOUNTER — Other Ambulatory Visit: Payer: Medicare HMO

## 2020-07-06 ENCOUNTER — Other Ambulatory Visit: Payer: Self-pay

## 2020-07-06 DIAGNOSIS — D751 Secondary polycythemia: Secondary | ICD-10-CM | POA: Diagnosis not present

## 2020-07-06 DIAGNOSIS — R42 Dizziness and giddiness: Secondary | ICD-10-CM

## 2020-07-07 LAB — BASIC METABOLIC PANEL
BUN/Creatinine Ratio: 11 — ABNORMAL LOW (ref 12–28)
BUN: 10 mg/dL (ref 8–27)
CO2: 21 mmol/L (ref 20–29)
Calcium: 9.9 mg/dL (ref 8.7–10.3)
Chloride: 102 mmol/L (ref 96–106)
Creatinine, Ser: 0.89 mg/dL (ref 0.57–1.00)
Glucose: 118 mg/dL — ABNORMAL HIGH (ref 65–99)
Potassium: 4.5 mmol/L (ref 3.5–5.2)
Sodium: 142 mmol/L (ref 134–144)
eGFR: 71 mL/min/{1.73_m2} (ref >59)

## 2020-07-07 LAB — CBC
Hematocrit: 48.8 % — ABNORMAL HIGH (ref 34.0–46.6)
Hemoglobin: 16.4 g/dL — ABNORMAL HIGH (ref 11.1–15.9)
MCH: 27.5 pg (ref 26.6–33.0)
MCHC: 33.6 g/dL (ref 31.5–35.7)
MCV: 82 fL (ref 79–97)
Platelets: 244 10*3/uL (ref 150–450)
RBC: 5.97 x10E6/uL — ABNORMAL HIGH (ref 3.77–5.28)
RDW: 12.5 % (ref 11.7–15.4)
WBC: 7.4 10*3/uL (ref 3.4–10.8)

## 2020-07-14 ENCOUNTER — Ambulatory Visit (HOSPITAL_COMMUNITY)
Admission: RE | Admit: 2020-07-14 | Discharge: 2020-07-14 | Disposition: A | Discharge status: Home / Self Care | Payer: Medicare HMO | Source: Ambulatory Visit | Attending: Family Medicine | Admitting: Family Medicine

## 2020-07-14 ENCOUNTER — Encounter: Payer: Self-pay | Admitting: Family Medicine

## 2020-07-14 ENCOUNTER — Ambulatory Visit (INDEPENDENT_AMBULATORY_CARE_PROVIDER_SITE_OTHER): Payer: Medicare HMO | Admitting: Family Medicine

## 2020-07-14 ENCOUNTER — Other Ambulatory Visit: Payer: Self-pay

## 2020-07-14 VITALS — BP 137/93 | HR 106 | Ht 69.0 in | Wt 198.8 lb

## 2020-07-14 DIAGNOSIS — M810 Age-related osteoporosis without current pathological fracture: Secondary | ICD-10-CM | POA: Diagnosis not present

## 2020-07-14 DIAGNOSIS — Z13220 Encounter for screening for lipoid disorders: Secondary | ICD-10-CM | POA: Diagnosis not present

## 2020-07-14 DIAGNOSIS — Z713 Dietary counseling and surveillance: Secondary | ICD-10-CM | POA: Insufficient documentation

## 2020-07-14 DIAGNOSIS — D751 Secondary polycythemia: Secondary | ICD-10-CM

## 2020-07-14 DIAGNOSIS — I10 Essential (primary) hypertension: Secondary | ICD-10-CM

## 2020-07-14 DIAGNOSIS — R000 Tachycardia, unspecified: Secondary | ICD-10-CM | POA: Insufficient documentation

## 2020-07-14 DIAGNOSIS — E781 Pure hyperglyceridemia: Secondary | ICD-10-CM | POA: Diagnosis not present

## 2020-07-14 MED ORDER — SODIUM BICARBONATE 650 MG PO TABS: 650.0000 mg | ORAL_TABLET | ORAL | 0 refills | Status: AC | PRN

## 2020-07-14 NOTE — Assessment & Plan Note (Signed)
Obtain lipid panel per patient's request.

## 2020-07-14 NOTE — Progress Notes (Signed)
SUBJECTIVE:   CHIEF COMPLAINT / HPI:   Laura Kaufman is a 65 y.o. female presents for follow up    Hypertension Seen in clinic 2 weeks ago for HTN  (BP 173/103), dizziness and headaches.  Started pt on Losartan 25mg  daily. Compliant with medications and tolerating well without side effects.  Checking BP at home with readings between 120 and 153. She had sold her house and much less stressed. Has followed a low salt diet.  Denies any SOB, CP, vision changes, LE edema, medication SEs, or symptoms of hypotension.   Most recent creatinine trend:  Lab Results  Component Value Date   CREATININE 0.89 07/06/2020   CREATININE 0.89 06/29/2020   CREATININE 0.70 04/24/2019     Patient has had a BMP in the past 1 year.   Polycythemia CBC at last visit shows Hb 16, Hct 48 and RBC 5.9. Ex smoker of 15 years, half a pack. Quit 20 years ago.  Patient denies abdominal pain, myalgia, weakness, fatigue, headache, blurred vision, excessive bleeding/bruising, night sweats, fevers, weight loss, vomiting, diarrhea, anorexia or dizziness.  No history of chronic lung disease, heart disease.  No family history of hematological malignancies.    [facility] Visit from 07/14/2020 in [place]  PHQ-9 Total Score 1       Health Maintenance Due  Topic  . COLONOSCOPY (Pts 45-69yrs Insurance coverage will need to be confirmed)   . COVID-19 Vaccine (4 - Booster for [employer] series)      PERTINENT  PMH / PSH: Hypertension  OBJECTIVE:   BP (!) 137/93   Pulse (!) 106   Ht 5\' 9"  (1.753 m)   Wt 198 lb 12.8 oz (90.2 kg)   SpO2 97%   BMI 29.36 kg/m    General: Alert, no acute distress, pleasant, well-appearing Cardio: Normal S1 and S2, RRR, no r/m/g Pulm: CTAB, normal work of breathing Abdomen: Bowel sounds normal. Abdomen soft and non-tender.  Extremities: No peripheral edema.  Neuro: Cranial nerves grossly intact   ASSESSMENT/PLAN:   Polycythemia Hemoglobin  16, hematocrit 48 and RBC 5.9 from previous labs on 4/18.  Her polycythemia dates back to recently 6 years ago.  Unclear cause of polycythemia.  Cannot rule out polycythemia rubra vera, myeloproliferative proliferative disorders, renal cell carcinoma, hepatocellular carcinoma etc. as a cause.  Patient does not have history of chronic lung disease or heart disease and is an ex-smoker.  It is reassuring that she does not have symptoms such as night sweats, fevers, weight loss etc. Also no family history of hematological malignancies.  Obtained peripheral smear today and referred to hematology for further work-up.   HTN (hypertension) BP 137/93 today, much improved.  Goal is 130/80.  Patient will continue to keep blood pressure diary at home and return for blood pressure check if blood pressures are persistently elevated above 130/80.  Can consider increasing Cozaar to 50 mg if persistently elevated.  Patient is also following low-salt diet and working on reducing her stress levels.  Tachycardia Unclear cause of tachycardia today, pulse was regular, 106 bpm.  Last visit pulse was 98 bpm.  EKG shows sinus rhythm without ischemic changes.  Could be secondary to dehydration as patient has not drank much today.  Less likely to be secondary to other causes such as anxiety/stress, sepsis, arrhythmias etc.  Screening for cholesterol level Obtain lipid panel per patient's request.     Lattie Haw, MD PGY-2 [place]

## 2020-07-14 NOTE — Assessment & Plan Note (Signed)
Unclear cause of tachycardia today, pulse was regular, 106 bpm.  Last visit pulse was 98 bpm.  EKG shows sinus rhythm without ischemic changes.  Could be secondary to dehydration as patient has not drank much today.  Less likely to be secondary to other causes such as anxiety/stress, sepsis, arrhythmias etc.

## 2020-07-14 NOTE — Patient Instructions (Signed)
Thank you for coming to see me today. It was a pleasure. Today we discussed your blood pressure. It is much better controlled. Ideally it should be 130/80. Continue to monitor it at home. I recommend coming back for a follow up if your Bps are persistently elevated above 130/80.  We are doing another blood test for the elevated Hemoglobin which is your blood count.i do not know why it is high. It has beenhigh for the last 6 years. I will refer you to hematology to investigate this further.   Please follow-up with PCP as and when you need.   If you have any questions or concerns, please do not hesitate to call the office at [phone].  Best wishes,   Dr Posey Pronto

## 2020-07-14 NOTE — Assessment & Plan Note (Addendum)
Hemoglobin 16, hematocrit 48 and RBC 5.9 from previous labs on 4/18.  Her polycythemia dates back to recently 6 years ago.  Unclear cause of polycythemia.  Cannot rule out polycythemia rubra vera, myeloproliferative proliferative disorders, renal cell carcinoma, hepatocellular carcinoma etc. as a cause.  Patient does not have history of chronic lung disease or heart disease and is an ex-smoker.  It is reassuring that she does not have symptoms such as night sweats, fevers, weight loss etc. Also no family history of hematological malignancies.  Obtained peripheral smear today and referred to hematology for further work-up.

## 2020-07-14 NOTE — Assessment & Plan Note (Signed)
BP 137/93 today, much improved.  Goal is 130/80.  Patient will continue to keep blood pressure diary at home and return for blood pressure check if blood pressures are persistently elevated above 130/80.  Can consider increasing Cozaar to 50 mg if persistently elevated.  Patient is also following low-salt diet and working on reducing her stress levels.

## 2020-07-15 ENCOUNTER — Telehealth: Payer: Self-pay | Admitting: Hematology and Oncology

## 2020-07-15 LAB — LIPID PANEL
Chol/HDL Ratio: 3.3 ratio (ref 0.0–4.4)
Cholesterol, Total: 149 mg/dL (ref 100–199)
HDL: 45 mg/dL (ref >39)
LDL Chol Calc (NIH): 76 mg/dL (ref 0–99)
Triglycerides: 163 mg/dL — ABNORMAL HIGH (ref 0–149)
VLDL Cholesterol Cal: 28 mg/dL (ref 5–40)

## 2020-07-15 NOTE — Telephone Encounter (Signed)
Received a new hem referral from Dr. Gwendlyn Deutscher for polycythemia. Laura Kaufman has been cld and scheduled to see Dr. Chryl Heck on 4/28 at 1040am. Pt aware toa rrive 20 minutes early.

## 2020-07-16 ENCOUNTER — Other Ambulatory Visit: Payer: Self-pay

## 2020-07-16 ENCOUNTER — Inpatient Hospital Stay: Payer: Medicare HMO | Attending: Hematology and Oncology | Admitting: Hematology and Oncology

## 2020-07-16 ENCOUNTER — Encounter: Payer: Self-pay | Admitting: Hematology and Oncology

## 2020-07-16 ENCOUNTER — Inpatient Hospital Stay: Payer: Medicare HMO

## 2020-07-16 ENCOUNTER — Telehealth: Payer: Self-pay | Admitting: Hematology and Oncology

## 2020-07-16 ENCOUNTER — Encounter: Payer: Self-pay | Admitting: Family Medicine

## 2020-07-16 VITALS — BP 161/96 | HR 86 | Temp 98.6°F | Resp 16 | Ht 69.0 in | Wt 199.3 lb

## 2020-07-16 DIAGNOSIS — Z79899 Other long term (current) drug therapy: Secondary | ICD-10-CM | POA: Diagnosis not present

## 2020-07-16 DIAGNOSIS — Z87891 Personal history of nicotine dependence: Secondary | ICD-10-CM | POA: Diagnosis not present

## 2020-07-16 DIAGNOSIS — D751 Secondary polycythemia: Secondary | ICD-10-CM | POA: Diagnosis not present

## 2020-07-16 DIAGNOSIS — K219 Gastro-esophageal reflux disease without esophagitis: Secondary | ICD-10-CM | POA: Insufficient documentation

## 2020-07-16 LAB — CBC WITH DIFFERENTIAL/PLATELET
Abs Immature Granulocytes: 0.02 10*3/uL (ref 0.00–0.07)
Basophils Absolute: 0.1 10*3/uL (ref 0.0–0.1)
Basophils Relative: 2 %
Eosinophils Absolute: 0.1 10*3/uL (ref 0.0–0.5)
Eosinophils Relative: 1 %
HCT: 48.5 % — ABNORMAL HIGH (ref 36.0–46.0)
Hemoglobin: 16.5 g/dL — ABNORMAL HIGH (ref 12.0–15.0)
Immature Granulocytes: 0 %
Lymphocytes Relative: 35 %
Lymphs Abs: 2.9 10*3/uL (ref 0.7–4.0)
MCH: 27.8 pg (ref 26.0–34.0)
MCHC: 34 g/dL (ref 30.0–36.0)
MCV: 81.8 fL (ref 80.0–100.0)
Monocytes Absolute: 0.7 10*3/uL (ref 0.1–1.0)
Monocytes Relative: 8 %
Neutro Abs: 4.5 10*3/uL (ref 1.7–7.7)
Neutrophils Relative %: 54 %
Platelets: 265 10*3/uL (ref 150–400)
RBC: 5.93 MIL/uL — ABNORMAL HIGH (ref 3.87–5.11)
RDW: 12.9 % (ref 11.5–15.5)
WBC: 8.2 10*3/uL (ref 4.0–10.5)
nRBC: 0 % (ref 0.0–0.2)

## 2020-07-16 LAB — PATHOLOGIST SMEAR REVIEW
Basophils Absolute: 0.1 10*3/uL (ref 0.0–0.2)
Basos: 1 %
EOS (ABSOLUTE): 0.1 10*3/uL (ref 0.0–0.4)
Eos: 1 %
Hematocrit: 49 % — ABNORMAL HIGH (ref 34.0–46.6)
Hemoglobin: 16.5 g/dL — ABNORMAL HIGH (ref 11.1–15.9)
Immature Grans (Abs): 0 10*3/uL (ref 0.0–0.1)
Immature Granulocytes: 0 %
Lymphocytes Absolute: 2.7 10*3/uL (ref 0.7–3.1)
Lymphs: 29 %
MCH: 27.8 pg (ref 26.6–33.0)
MCHC: 33.7 g/dL (ref 31.5–35.7)
MCV: 83 fL (ref 79–97)
Monocytes Absolute: 0.6 10*3/uL (ref 0.1–0.9)
Monocytes: 6 %
Neutrophils Absolute: 5.8 10*3/uL (ref 1.4–7.0)
Neutrophils: 63 %
Platelets: 260 10*3/uL (ref 150–450)
RBC: 5.94 x10E6/uL — ABNORMAL HIGH (ref 3.77–5.28)
RDW: 13.4 % (ref 11.7–15.4)
WBC: 9.2 10*3/uL (ref 3.4–10.8)

## 2020-07-16 LAB — CMP (CANCER CENTER ONLY)
ALT: 57 U/L — ABNORMAL HIGH (ref 0–44)
AST: 40 U/L (ref 15–41)
Albumin: 4.4 g/dL (ref 3.5–5.0)
Alkaline Phosphatase: 142 U/L — ABNORMAL HIGH (ref 38–126)
Anion gap: 13 (ref 5–15)
BUN: 10 mg/dL (ref 8–23)
CO2: 23 mmol/L (ref 22–32)
Calcium: 10.3 mg/dL (ref 8.9–10.3)
Chloride: 106 mmol/L (ref 98–111)
Creatinine: 0.78 mg/dL (ref 0.44–1.00)
GFR, Estimated: >60 mL/min (ref >60)
Glucose, Bld: 102 mg/dL — ABNORMAL HIGH (ref 70–99)
Potassium: 4.3 mmol/L (ref 3.5–5.1)
Sodium: 142 mmol/L (ref 135–145)
Total Bilirubin: 0.5 mg/dL (ref 0.3–1.2)
Total Protein: 7.8 g/dL (ref 6.5–8.1)

## 2020-07-16 NOTE — Assessment & Plan Note (Signed)
This is a very pleasant 65 year old female patient with past medical history significant for GERD referred to hematology for evaluation of persistently elevated hemoglobin.  Laura Kaufman denies any new health complaints except for recent episode of dizziness and hypertension after tremendous amount of stress which has resolved since then.  She was noted to have polycythemia at this time and hence referred to hematology.  She tells me that she has had this for a while.  She denies any new health complaints. Physical examination is unremarkable, no palpable lymphadenopathy or hepatosplenomegaly.  At this time, I believe this is likely secondary polycythemia from possible sleep apnea especially with its chronicity and lack of other associated symptoms given her age and persistent polycythemia, we have agreed to proceed with myeloproliferative work-up which has been ordered today. She will return to clinic in a few weeks to review labs and to discuss any additional recommendations.

## 2020-07-16 NOTE — Telephone Encounter (Signed)
Scheduled per los. Confirmed appts. Declined printout  

## 2020-07-16 NOTE — Progress Notes (Signed)
[facility] CONSULT NOTE  Patient Care Team: Autry-Lott, Naaman Plummer, DO as PCP - General (Family Medicine)  CHIEF COMPLAINTS/PURPOSE OF CONSULTATION:  polycythemia  ASSESSMENT & PLAN:   Polycythemia This is a very pleasant 65 year old female patient with past medical history significant for GERD referred to hematology for evaluation of persistently elevated hemoglobin.  Ms. Lafond denies any new health complaints except for recent episode of dizziness and hypertension after tremendous amount of stress which has resolved since then.  She was noted to have polycythemia at this time and hence referred to hematology.  She tells me that she has had this for a while.  She denies any new health complaints. Physical examination is unremarkable, no palpable lymphadenopathy or hepatosplenomegaly.  At this time, I believe this is likely secondary polycythemia from possible sleep apnea especially with its chronicity and lack of other associated symptoms given her age and persistent polycythemia, we have agreed to proceed with myeloproliferative work-up which has been ordered today. She will return to clinic in a few weeks to review labs and to discuss any additional recommendations. Patient also had questions about weight loss, discussed portion control and incorporating exercise and adequate hydration to facilitate weight loss.  Orders Placed This Encounter  Procedures  . CBC with Differential/Platelet    Standing Status:   Standing    Number of Occurrences:   22    Standing Expiration Date:   07/16/2021  . JAK2 (INCLUDING V617F AND EXON 12), MPL,& CALR W/RFL MPN PANEL (NGS)  . Erythropoietin    Standing Status:   Future    Number of Occurrences:   1    Standing Expiration Date:   07/16/2021  . CMP (Cancer Center only)    Standing Status:   Future    Number of Occurrences:   1    Standing Expiration Date:   07/16/2021     HISTORY OF PRESENTING ILLNESS:   Laura Kaufman 64 y.o.  female is here because of polycythemia  This is a very pleasant 65 year old female patient with a past medical history significant for GERD referred to hematology for evaluation of polycythemia. Patient denies any new health complaints except for tremendous amounts of stress in the past couple months which has significantly improved.  She denies any B symptoms, history of sleep apnea, known lung or cardiac disorders.  She donated blood a very long time ago.  She does not remember any hematological disorders that run in the family.  She was once told while she was in the hospital that she may have sleep apnea, she does snore at night but denies any daytime drowsiness except for an afternoon nap and feels refreshed most of the time when she wakes up. She is not up-to-date with her mammogram, colonoscopy and does not need a Pap smear. No family history of malignancies. Rest of the pertinent 10 point ROS reviewed and negative.  REVIEW OF SYSTEMS:   Constitutional: Denies fevers, chills or abnormal night sweats Eyes: Denies blurriness of vision, double vision or watery eyes Ears, nose, mouth, throat, and face: Denies mucositis or sore throat Respiratory: Denies cough, dyspnea or wheezes Cardiovascular: Denies palpitation, chest discomfort or lower extremity swelling Gastrointestinal:  Denies nausea, heartburn or change in bowel habits Skin: Denies abnormal skin rashes Lymphatics: Denies new lymphadenopathy or easy bruising Neurological:Denies numbness, tingling or new weaknesses Behavioral/Psych: Mood is stable, no new changes  All other systems were reviewed with the patient and are negative.  MEDICAL HISTORY:  Past Medical  History:  Diagnosis Date  . Allergy   . GERD (gastroesophageal reflux disease)     SURGICAL HISTORY: Past Surgical History:  Procedure Laterality Date  . ABDOMINAL HYSTERECTOMY     total  . CATARACT EXTRACTION, BILATERAL    . ELBOW FRACTURE SURGERY Bilateral   .  KNEE SURGERY    . WRIST SURGERY     no surgery per pt, but "popped bones back into place"    SOCIAL HISTORY: Social History   Socioeconomic History  . Marital status: Unknown    Spouse name: Not on file  . Number of children: Not on file  . Years of education: Not on file  . Highest education level: Not on file  Occupational History  . Not on file  Tobacco Use  . Smoking status: Former Research scientist (life sciences)  . Smokeless tobacco: Never Used  . Tobacco comment: quit 20 years ago  Vaping Use  . Vaping Use: Never used  Substance and Sexual Activity  . Alcohol use: Yes    Comment: occasionally, 1x every 3 months (wine with dinner)  . Drug use: Never  . Sexual activity: Not Currently    Partners: Male    Birth control/protection: Post-menopausal  Other Topics Concern  . Not on file  Social History Narrative  . Not on file   Social Determinants of Health   Financial Resource Strain: Not on file  Food Insecurity: Not on file  Transportation Needs: Not on file  Physical Activity: Not on file  Stress: Not on file  Social Connections: Not on file  Intimate Partner Violence: Not on file    FAMILY HISTORY: Family History  Problem Relation Age of Onset  . Colon cancer Mother 45  . Esophageal cancer Neg Hx   . Stomach cancer Neg Hx   . Rectal cancer Neg Hx     ALLERGIES:  is allergic to bee venom and penicillins.  MEDICATIONS:  Current Outpatient Medications  Medication Sig Dispense Refill  . losartan (COZAAR) 25 MG tablet Take 1 tablet (25 mg total) by mouth daily. 30 tablet 0  . sodium bicarbonate 650 MG tablet Take 1 tablet (650 mg total) by mouth as needed for heartburn. 30 tablet 0  . Turmeric 500 MG TABS Take by mouth daily.    Marland Kitchen alendronate (FOSAMAX) 70 MG tablet Take 1 tablet (70 mg total) by mouth once a week. Take with a full glass of water on an empty stomach. 30 tablet 0   No current facility-administered medications for this visit.     PHYSICAL EXAMINATION:  ECOG  PERFORMANCE STATUS: 0 - Asymptomatic  Vitals:   07/16/20 1044  BP: (!) 161/96  Pulse: 86  Resp: 16  Temp: 98.6 F (37 C)  SpO2: 99%   Filed Weights   07/16/20 1044  Weight: 199 lb 4.8 oz (90.4 kg)    GENERAL:alert, no distress and comfortable, obese SKIN: skin color, texture, turgor are normal, no rashes or significant lesions EYES: normal, conjunctiva are pink and non-injected, sclera clear OROPHARYNX:no exudate, no erythema and lips, buccal mucosa, and tongue normal  NECK: supple, thyroid normal size, non-tender, without nodularity LYMPH:  no palpable lymphadenopathy in the cervical, axillary or inguinal LUNGS: clear to auscultation and percussion with normal breathing effort HEART: regular rate & rhythm and no murmurs and no lower extremity edema ABDOMEN:abdomen soft, non-tender and normal bowel sounds Musculoskeletal:no cyanosis of digits and no clubbing  PSYCH: alert & oriented x 3 with fluent speech NEURO: no focal motor/sensory deficits  LABORATORY DATA:  I have reviewed the data as listed Lab Results  Component Value Date   WBC 8.2 07/16/2020   HGB 16.5 (H) 07/16/2020   HCT 48.5 (H) 07/16/2020   MCV 81.8 07/16/2020   PLT 265 07/16/2020     Chemistry      Component Value Date/Time   NA 142 07/16/2020 1137   NA 142 07/06/2020 0848   K 4.3 07/16/2020 1137   CL 106 07/16/2020 1137   CO2 23 07/16/2020 1137   BUN 10 07/16/2020 1137   BUN 10 07/06/2020 0848   CREATININE 0.78 07/16/2020 1137      Component Value Date/Time   CALCIUM 10.3 07/16/2020 1137   ALKPHOS 142 (H) 07/16/2020 1137   AST 40 07/16/2020 1137   ALT 57 (H) 07/16/2020 1137   BILITOT 0.5 07/16/2020 1137     I reviewed her labs over the past 6 years.  She has had mild polycythemia for several years with hemoglobin mostly ranging around 16 g/dL.  No leukocytosis, thrombocytosis noted.  RADIOGRAPHIC STUDIES: I have personally reviewed the radiological images as listed and agreed with the  findings in the report. No results found.  All questions were answered. The patient knows to call the clinic with any problems, questions or concerns. I spent 35 minutes in the care of this patient including H and P, review of records, counseling and coordination of care.     Benay Pike, MD 07/16/2020 2:14 PM

## 2020-07-18 LAB — ERYTHROPOIETIN: Erythropoietin: 4.3 m[IU]/mL (ref 2.6–18.5)

## 2020-07-21 LAB — JAK2 (INCLUDING V617F AND EXON 12), MPL,& CALR W/RFL MPN PANEL (NGS)

## 2020-07-23 ENCOUNTER — Other Ambulatory Visit: Payer: Self-pay | Admitting: Family Medicine

## 2020-08-06 ENCOUNTER — Inpatient Hospital Stay: Payer: Medicare HMO | Attending: Hematology and Oncology | Admitting: Hematology and Oncology

## 2020-08-06 ENCOUNTER — Other Ambulatory Visit: Payer: Self-pay

## 2020-08-06 ENCOUNTER — Encounter: Payer: Self-pay | Admitting: Hematology and Oncology

## 2020-08-06 DIAGNOSIS — Z87891 Personal history of nicotine dependence: Secondary | ICD-10-CM | POA: Diagnosis not present

## 2020-08-06 DIAGNOSIS — R0683 Snoring: Secondary | ICD-10-CM | POA: Diagnosis not present

## 2020-08-06 DIAGNOSIS — Z880 Allergy status to penicillin: Secondary | ICD-10-CM | POA: Insufficient documentation

## 2020-08-06 DIAGNOSIS — Z79899 Other long term (current) drug therapy: Secondary | ICD-10-CM | POA: Insufficient documentation

## 2020-08-06 DIAGNOSIS — D751 Secondary polycythemia: Secondary | ICD-10-CM | POA: Insufficient documentation

## 2020-08-06 DIAGNOSIS — Z91030 Bee allergy status: Secondary | ICD-10-CM | POA: Diagnosis not present

## 2020-08-06 DIAGNOSIS — Z800 Family history of malignant neoplasm of digestive organs: Secondary | ICD-10-CM | POA: Insufficient documentation

## 2020-08-06 DIAGNOSIS — Z713 Dietary counseling and surveillance: Secondary | ICD-10-CM

## 2020-08-06 NOTE — Assessment & Plan Note (Signed)
Patient mentioned that she is really motivated to lose some weight.  We have discussed about multiple ways to cut down weight including but not limited to portion control, hydration, regular exercise, cutting down on unhealthy snacks and making small goals at a time.  She will try to work on it and will seek advice as needed.

## 2020-08-06 NOTE — Assessment & Plan Note (Signed)
This is a very pleasant 65 year old female patient with persistent polycythemia which has been ongoing for years with hemoglobin around 15 to 16 g referred to hematology for further recommendations.  She denies any health complaints during her last visit except for tremendous amounts of stress.  We have discussed about myeloproliferative work-up and there appears to be no evidence of mutation suggestive of a myeloid neoplasm.  She does remember being told that she snores quite a bit and wondered if she has sleep apnea.  We have discussed about common causes of secondary polycythemia today again including but not limited to cardiovascular issues, pulmonary issues, untreated sleep apnea, high-altitude living etc.  I have recommended that she consider a sleep study which could be contributing to some degree of polycythemia.  We have discussed about considering blood donation once or twice a year.  She was encouraged to contact us right away if she has any evidence of hyperviscosity symptoms which we have discussed. She will return to clinic in 6 months otherwise.

## 2020-08-06 NOTE — Progress Notes (Signed)
[place] CONSULT NOTE  Patient Care Team: Autry-Lott, Naaman Plummer, DO as PCP - General (Family Medicine)  CHIEF COMPLAINTS/PURPOSE OF CONSULTATION:  Follow up after MPN work up  [place]:   Polycythemia This is a very pleasant 65 year old female patient with persistent polycythemia which has been ongoing for years with hemoglobin around 15 to 16 g referred to hematology for further recommendations.  She denies any health complaints during her last visit except for tremendous amounts of stress.  We have discussed about myeloproliferative work-up and there appears to be no evidence of mutation suggestive of a myeloid neoplasm.  She does remember being told that she snores quite a bit and wondered if she has sleep apnea.  We have discussed about common causes of secondary polycythemia today again including but not limited to cardiovascular issues, pulmonary issues, untreated sleep apnea, high-altitude living etc.  I have recommended that she consider a sleep study which could be contributing to some degree of polycythemia.  We have discussed about considering blood donation once or twice a year.  She was encouraged to contact us right away if she has any evidence of hyperviscosity symptoms which we have discussed. She will return to clinic in 6 months otherwise.  Encounter for weight loss counseling Patient mentioned that she is really motivated to lose some weight.  We have discussed about multiple ways to cut down weight including but not limited to portion control, hydration, regular exercise, cutting down on unhealthy snacks and making small goals at a time.  She will try to work on it and will seek advice as needed.  No orders of the defined types were placed in this encounter.    HISTORY OF PRESENTING ILLNESS:   Laura Kaufman 65 y.o. female is here because of polycythemia.  Mr. Sutphen is here for follow-up since her initial visit.  We have discussed about  myeloproliferative work-up for her persistent polycythemia and she is here to discuss the lab results and any additional recommendations.  She is here with her fianc.  She continues to feel well, denies any complaints, stress has been significantly better.  She denies any hyperviscosity symptoms for me today.  She is wondering if she can proceed with her colonoscopy which is overdue.  She is also wondering if any suggestions regarding weight loss.  Rest of the pertinent 10 point ROS reviewed and negative.  REVIEW OF SYSTEMS:   Constitutional: Denies fevers, chills or abnormal night sweats Eyes: Denies blurriness of vision, double vision or watery eyes Ears, nose, mouth, throat, and face: Denies mucositis or sore throat Respiratory: Denies cough, dyspnea or wheezes Cardiovascular: Denies palpitation, chest discomfort or lower extremity swelling Gastrointestinal:  Denies nausea, heartburn or change in bowel habits Skin: Denies abnormal skin rashes Lymphatics: Denies new lymphadenopathy or easy bruising Neurological:Denies numbness, tingling or new weaknesses Behavioral/Psych: Mood is stable, no new changes  All other systems were reviewed with the patient and are negative.  MEDICAL HISTORY:  Past Medical History:  Diagnosis Date  . Allergy   . GERD (gastroesophageal reflux disease)     SURGICAL HISTORY: Past Surgical History:  Procedure Laterality Date  . ABDOMINAL HYSTERECTOMY     total  . CATARACT EXTRACTION, BILATERAL    . ELBOW FRACTURE SURGERY Bilateral   . KNEE SURGERY    . WRIST SURGERY     no surgery per pt, but "popped bones back into place"    SOCIAL HISTORY: Social History   Socioeconomic History  .  Marital status: Unknown    Spouse name: Not on file  . Number of children: Not on file  . Years of education: Not on file  . Highest education level: Not on file  Occupational History  . Not on file  Tobacco Use  . Smoking status: Former Research scientist (life sciences)  . Smokeless  tobacco: Never Used  . Tobacco comment: quit 20 years ago  Vaping Use  . Vaping Use: Never used  Substance and Sexual Activity  . Alcohol use: Yes    Comment: occasionally, 1x every 3 months (wine with dinner)  . Drug use: Never  . Sexual activity: Not Currently    Partners: Male    Birth control/protection: Post-menopausal  Other Topics Concern  . Not on file  Social History Narrative  . Not on file   Social Determinants of Health   Financial Resource Strain: Not on file  Food Insecurity: Not on file  Transportation Needs: Not on file  Physical Activity: Not on file  Stress: Not on file  Social Connections: Not on file  Intimate Partner Violence: Not on file    FAMILY HISTORY: Family History  Problem Relation Age of Onset  . Colon cancer Mother 72  . Esophageal cancer Neg Hx   . Stomach cancer Neg Hx   . Rectal cancer Neg Hx     ALLERGIES:  is allergic to bee venom and penicillins.  MEDICATIONS:  Current Outpatient Medications  Medication Sig Dispense Refill  . losartan (COZAAR) 25 MG tablet TAKE 1 TABLET BY MOUTH DAILY 30 tablet 0  . sodium bicarbonate 650 MG tablet Take 1 tablet (650 mg total) by mouth as needed for heartburn. 30 tablet 0  . Turmeric 500 MG TABS Take by mouth daily.     No current facility-administered medications for this visit.   PHYSICAL EXAMINATION:  ECOG PERFORMANCE STATUS: 0 - Asymptomatic  Vitals:   08/06/20 0955  BP: (!) 148/92  Pulse: 87  Resp: 20  Temp: (!) 97.5 F (36.4 C)  SpO2: 98%   Filed Weights   08/06/20 0955  Weight: 199 lb 9.6 oz (90.5 kg)   GENERAL:alert, no distress and comfortable SKIN: skin color, texture, turgor are normal, no rashes or significant lesions EYES: normal, conjunctiva are pink and non-injected, sclera clear OROPHARYNX:no exudate, no erythema and lips, buccal mucosa, and tongue normal  NECK: supple, thyroid normal size, non-tender, without nodularity LYMPH:  no palpable lymphadenopathy in the  cervical, axillary  LUNGS: clear to auscultation and percussion with normal breathing effort HEART: regular rate & rhythm and no murmurs and no lower extremity edema ABDOMEN:abdomen soft, non-tender and normal bowel sounds Musculoskeletal:no cyanosis of digits and no clubbing  PSYCH: alert & oriented x 3 with fluent speech NEURO: no focal motor/sensory deficits  LABORATORY DATA:  I have reviewed the data as listed Lab Results  Component Value Date   WBC 8.2 07/16/2020   HGB 16.5 (H) 07/16/2020   HCT 48.5 (H) 07/16/2020   MCV 81.8 07/16/2020   PLT 265 07/16/2020     Chemistry      Component Value Date/Time   NA 142 07/16/2020 1137   NA 142 07/06/2020 0848   K 4.3 07/16/2020 1137   CL 106 07/16/2020 1137   CO2 23 07/16/2020 1137   BUN 10 07/16/2020 1137   BUN 10 07/06/2020 0848   CREATININE 0.78 07/16/2020 1137      Component Value Date/Time   CALCIUM 10.3 07/16/2020 1137   ALKPHOS 142 (H) 07/16/2020 1137  AST 40 07/16/2020 1137   ALT 57 (H) 07/16/2020 1137   BILITOT 0.5 07/16/2020 1137     RADIOGRAPHIC STUDIES: I have personally reviewed the radiological images as listed and agreed with the findings in the report.  I have reviewed her MPN work-up results, no evidence of JAK2 mutation, CAL reticulin, MPL codon mutation.  All questions were answered. The patient knows to call the clinic with any problems, questions or concerns. I spent 30 minutes in the care of this patient including H and P, review of records, counseling and coordination of care. We have discussed about causes of polycythemia, reviewed work-up for myeloproliferative disorders, discussed about blood donation, weight loss counseling and ways to work on weight loss.     Benay Pike, MD 08/06/2020 11:38 AM

## 2020-08-14 ENCOUNTER — Encounter: Payer: Self-pay | Admitting: Family Medicine

## 2020-08-15 ENCOUNTER — Encounter: Payer: Self-pay | Admitting: Family Medicine

## 2020-08-15 DIAGNOSIS — E781 Pure hyperglyceridemia: Secondary | ICD-10-CM | POA: Insufficient documentation

## 2020-08-20 ENCOUNTER — Encounter: Payer: Self-pay | Admitting: Gastroenterology

## 2020-08-20 ENCOUNTER — Other Ambulatory Visit: Payer: Self-pay | Admitting: Family Medicine

## 2020-08-20 DIAGNOSIS — Z23 Encounter for immunization: Secondary | ICD-10-CM

## 2020-08-21 ENCOUNTER — Other Ambulatory Visit: Payer: Self-pay

## 2020-08-21 MED ORDER — LOSARTAN POTASSIUM 25 MG PO TABS: 25.0000 mg | ORAL_TABLET | Freq: Every day | ORAL | 0 refills | Status: DC

## 2020-09-14 ENCOUNTER — Ambulatory Visit (AMBULATORY_SURGERY_CENTER): Payer: Medicare HMO | Admitting: *Deleted

## 2020-09-14 ENCOUNTER — Other Ambulatory Visit: Payer: Self-pay

## 2020-09-14 VITALS — Ht 69.0 in | Wt 197.0 lb

## 2020-09-14 DIAGNOSIS — Z86010 Personal history of colonic polyps: Secondary | ICD-10-CM

## 2020-09-14 MED ORDER — NA SULFATE-K SULFATE-MG SULF 17.5-3.13-1.6 GM/177ML PO SOLN: ORAL | 0 refills | Status: DC

## 2020-09-14 NOTE — Progress Notes (Signed)
Patient is here in-person for PV. Patient denies any allergies to eggs or soy. Patient denies any problems with anesthesia/sedation. Patient denies any oxygen use at home. Patient denies taking any diet/weight loss medications or blood thinners. Patient is not being treated for MRSA or C-diff. Patient is aware of our care-partner policy and ORJGY-56 safety protocol. EMMI education assigned to the patient for the procedure, sent to [facility].   Patient is COVID-19 vaccinated, per patient.  Patient denies constipation.

## 2020-09-15 ENCOUNTER — Other Ambulatory Visit: Payer: Self-pay | Admitting: Family Medicine

## 2020-10-06 ENCOUNTER — Encounter: Payer: Self-pay | Admitting: Gastroenterology

## 2020-10-06 ENCOUNTER — Other Ambulatory Visit: Payer: Self-pay

## 2020-10-06 ENCOUNTER — Ambulatory Visit (AMBULATORY_SURGERY_CENTER): Payer: Medicare HMO | Admitting: Gastroenterology

## 2020-10-06 VITALS — BP 113/76 | HR 72 | Temp 97.6°F | Resp 20 | Ht 69.0 in | Wt 197.0 lb

## 2020-10-06 DIAGNOSIS — R195 Other fecal abnormalities: Secondary | ICD-10-CM

## 2020-10-06 DIAGNOSIS — D120 Benign neoplasm of cecum: Secondary | ICD-10-CM

## 2020-10-06 DIAGNOSIS — D128 Benign neoplasm of rectum: Secondary | ICD-10-CM

## 2020-10-06 DIAGNOSIS — K635 Polyp of colon: Secondary | ICD-10-CM | POA: Diagnosis not present

## 2020-10-06 DIAGNOSIS — D125 Benign neoplasm of sigmoid colon: Secondary | ICD-10-CM

## 2020-10-06 DIAGNOSIS — D123 Benign neoplasm of transverse colon: Secondary | ICD-10-CM | POA: Diagnosis not present

## 2020-10-06 DIAGNOSIS — Z86010 Personal history of colonic polyps: Secondary | ICD-10-CM | POA: Diagnosis not present

## 2020-10-06 DIAGNOSIS — D122 Benign neoplasm of ascending colon: Secondary | ICD-10-CM

## 2020-10-06 DIAGNOSIS — K5730 Diverticulosis of large intestine without perforation or abscess without bleeding: Secondary | ICD-10-CM

## 2020-10-06 DIAGNOSIS — D124 Benign neoplasm of descending colon: Secondary | ICD-10-CM

## 2020-10-06 HISTORY — PX: COLONOSCOPY: SHX174

## 2020-10-06 MED ORDER — SODIUM CHLORIDE 0.9 % IV SOLN: 500.0000 mL | Freq: Once | INTRAVENOUS | Status: DC

## 2020-10-06 NOTE — Op Note (Signed)
[facility] Patient Name: Laura Kaufman Procedure Date: 10/06/2020 10:32 AM MRN: [id number] Endoscopist: Gerrit Heck , MD Age: 65 Referring MD:  Date of Birth: [birth year] Gender: Female Account #: [id number] Procedure:                Colonoscopy Indications:              Surveillance: History of numerous (> 10) adenomas                            on last colonoscopy.                           Colonoscopy in 03/2019 with >10 Tubular Adenomas and                            Sessile Serrated Polyps. She presents today for                            short interval surveillance. Otherwise, no current                            GI symptoms. Medicines:                Monitored Anesthesia Care Procedure:                Pre-Anesthesia Assessment:                           - Prior to the procedure, a History and Physical                            was performed, and patient medications and                            allergies were reviewed. The patient's tolerance of                            previous anesthesia was also reviewed. The risks                            and benefits of the procedure and the sedation                            options and risks were discussed with the patient.                            All questions were answered, and informed consent                            was obtained. Prior Anticoagulants: The patient has                            taken no previous anticoagulant or antiplatelet  agents. ASA Grade Assessment: II - A patient with                            mild systemic disease. After reviewing the risks                            and benefits, the patient was deemed in                            satisfactory condition to undergo the procedure.                           After obtaining informed consent, the colonoscope                            was passed under direct vision. Throughout the                             procedure, the patient's blood pressure, pulse, and                            oxygen saturations were monitored continuously. The                            [id number] was introduced through the anus and                            advanced to the the cecum, identified by                            appendiceal orifice and ileocecal valve. The                            colonoscopy was performed without difficulty. The                            patient tolerated the procedure well. The quality                            of the bowel preparation was good. The ileocecal                            valve, appendiceal orifice, and rectum were                            photographed. Scope In: 10:50:51 AM Scope Out: 11:21:58 AM Scope Withdrawal Time: 0 hours 25 minutes 0 seconds  Total Procedure Duration: 0 hours 31 minutes 7 seconds  Findings:                 The perianal and digital rectal examinations were                            normal.  Four sessile polyps were found in the transverse                            colon (2), ascending colon and cecum. The polyps                            were 3 to 8 mm in size. These polyps were removed                            with a cold snare. Resection and retrieval were                            complete. Estimated blood loss was minimal.                           Three sessile polyps were found in the rectum,                            sigmoid colon and descending colon. The polyps were                            3 to 5 mm in size. These polyps were removed with a                            cold snare. Resection and retrieval were complete.                            Estimated blood loss was minimal.                           There was a small lipoma, in the ascending colon.                           A few small-mouthed diverticula were found in the                            sigmoid colon and ascending colon.                            The retroflexed view of the distal rectum and anal                            verge was normal and showed no anal or rectal                            abnormalities. Complications:            No immediate complications. Estimated Blood Loss:     Estimated blood loss was minimal. Impression:               - Four 3 to 8 mm polyps in the transverse colon, in  the ascending colon and in the cecum, removed with                            a cold snare. Resected and retrieved.                           - Three 3 to 5 mm polyps in the rectum, in the                            sigmoid colon and in the descending colon, removed                            with a cold snare. Resected and retrieved.                           - Small lipoma in the ascending colon.                           - Diverticulosis in the sigmoid colon and in the                            ascending colon.                           - The distal rectum and anal verge are normal on                            retroflexion view. Recommendation:           - Patient has a contact number available for                            emergencies. The signs and symptoms of potential                            delayed complications were discussed with the                            patient. Return to normal activities tomorrow.                            Written discharge instructions were provided to the                            patient.                           - Resume previous diet.                           - Continue present medications.                           - Await pathology results.                           -  Repeat colonoscopy in 2 years for surveillance.                           - Based on the number of adenomatous polyps and per                            current GI societal guidelines, will place referral                            to the [facility] for an appointment to be                             scheduled. Gerrit Heck, MD 10/06/2020 11:29:01 AM

## 2020-10-06 NOTE — Progress Notes (Signed)
Pt didn't pass air while in the RR.  Abdomen soft, easily palpable; no distention noted.  She denies discomfort of any kind.  Told to ambulate and drink warm fluids if she develops any abdominal pain of any kind

## 2020-10-06 NOTE — Progress Notes (Signed)
pt tolerated well. VSS. awake and to recovery. Report given to RN.  

## 2020-10-06 NOTE — Patient Instructions (Signed)
Await pathology  Please read over handout about polyps and diverticulosis  Continue your normal medications  Next colonoscopy- 2 years  YOU HAD AN ENDOSCOPIC PROCEDURE TODAY AT [facility]:   Refer to the procedure report that was given to you for any specific questions about what was found during the examination.  If the procedure report does not answer your questions, please call your gastroenterologist to clarify.  If you requested that your care partner not be given the details of your procedure findings, then the procedure report has been included in a sealed envelope for you to review at your convenience later.  YOU SHOULD EXPECT: Some feelings of bloating in the abdomen. Passage of more gas than usual.  Walking can help get rid of the air that was put into your GI tract during the procedure and reduce the bloating. If you had a lower endoscopy (such as a colonoscopy or flexible sigmoidoscopy) you may notice spotting of blood in your stool or on the toilet paper. If you underwent a bowel prep for your procedure, you may not have a normal bowel movement for a few days.  Please Note:  You might notice some irritation and congestion in your nose or some drainage.  This is from the oxygen used during your procedure.  There is no need for concern and it should clear up in a day or so.  SYMPTOMS TO REPORT IMMEDIATELY:  Following lower endoscopy (colonoscopy or flexible sigmoidoscopy):  Excessive amounts of blood in the stool  Significant tenderness or worsening of abdominal pains  Swelling of the abdomen that is new, acute  Fever of 100F or higher  Following upper endoscopy (EGD)  Vomiting of blood or coffee ground material  New chest pain or pain under the shoulder blades  Painful or persistently difficult swallowing  New shortness of breath  Fever of 100F or higher  Black, tarry-looking stools  For urgent or emergent issues, a gastroenterologist can be reached at  any hour by calling [phone]. Do not use MyChart messaging for urgent concerns.    DIET:  We do recommend a small meal at first, but then you may proceed to your regular diet.  Drink plenty of fluids but you should avoid alcoholic beverages for 24 hours.  ACTIVITY:  You should plan to take it easy for the rest of today and you should NOT DRIVE or use heavy machinery until tomorrow (because of the sedation medicines used during the test).    FOLLOW UP: Our staff will call the number listed on your records 48-72 hours following your procedure to check on you and address any questions or concerns that you may have regarding the information given to you following your procedure. If we do not reach you, we will leave a message.  We will attempt to reach you two times.  During this call, we will ask if you have developed any symptoms of COVID 19. If you develop any symptoms (ie: fever, flu-like symptoms, shortness of breath, cough etc.) before then, please call [phone].  If you test positive for Covid 19 in the 2 weeks post procedure, please call and report this information to [place].    If any biopsies were taken you will be contacted by phone or by letter within the next 1-3 weeks.  Please call us at [phone] if you have not heard about the biopsies in 3 weeks.    SIGNATURES/CONFIDENTIALITY: You and/or your care partner have signed paperwork which will  be entered into your electronic medical record.  These signatures attest to the fact that that the information above on your After Visit Summary has been reviewed and is understood.  Full responsibility of the confidentiality of this discharge information lies with you and/or your care-partner.

## 2020-10-06 NOTE — Progress Notes (Signed)
Called to room to assist during endoscopic procedure.  Patient ID and intended procedure confirmed with present staff. Received instructions for my participation in the procedure from the performing physician.  

## 2020-10-06 NOTE — Progress Notes (Signed)
Pt's states no medical or surgical changes since previsit or office visit. 

## 2020-10-06 NOTE — Progress Notes (Signed)
C.W. vital signs. 

## 2020-10-08 ENCOUNTER — Other Ambulatory Visit: Payer: Self-pay

## 2020-10-08 ENCOUNTER — Telehealth: Payer: Self-pay | Admitting: Hematology and Oncology

## 2020-10-08 ENCOUNTER — Telehealth: Payer: Self-pay

## 2020-10-08 DIAGNOSIS — K635 Polyp of colon: Secondary | ICD-10-CM

## 2020-10-08 NOTE — Telephone Encounter (Signed)
  Follow up Call-  Call back number 10/06/2020 04/17/2019  Post procedure Call Back phone  # [phone]  Permission to leave phone message Yes Yes  Some recent data might be hidden     Patient questions:  Do you have a fever, pain , or abdominal swelling? No. Pain Score  0 *  Have you tolerated food without any problems? Yes.    Have you been able to return to your normal activities? Yes.    Do you have any questions about your discharge instructions: Diet   No. Medications  No. Follow up visit  No.  Do you have questions or concerns about your Care? No.  Actions: * If pain score is 4 or above: No action needed, pain <4.  Have you developed a fever since your procedure? no  2.   Have you had an respiratory symptoms (SOB or cough) since your procedure? no  3.   Have you tested positive for COVID 19 since your procedure no  4.   Have you had any family members/close contacts diagnosed with the COVID 19 since your procedure?  no   If yes to any of these questions please route to Joylene John, RN and Joella Prince, RN

## 2020-10-08 NOTE — Telephone Encounter (Signed)
Scheduled appointment per 07/21 sch msg. Left message. 

## 2020-10-09 ENCOUNTER — Encounter: Payer: Self-pay | Admitting: Gastroenterology

## 2020-10-20 ENCOUNTER — Other Ambulatory Visit: Payer: Self-pay

## 2020-10-20 ENCOUNTER — Inpatient Hospital Stay: Payer: Medicare HMO | Attending: Hematology and Oncology | Admitting: Genetic Counselor

## 2020-10-20 ENCOUNTER — Inpatient Hospital Stay: Payer: Medicare HMO

## 2020-10-20 DIAGNOSIS — Z86010 Personal history of colonic polyps: Secondary | ICD-10-CM

## 2020-10-20 DIAGNOSIS — Z808 Family history of malignant neoplasm of other organs or systems: Secondary | ICD-10-CM | POA: Diagnosis not present

## 2020-10-21 LAB — GENETIC SCREENING ORDER

## 2020-10-22 ENCOUNTER — Ambulatory Visit (INDEPENDENT_AMBULATORY_CARE_PROVIDER_SITE_OTHER): Payer: Medicare HMO | Admitting: Family Medicine

## 2020-10-22 ENCOUNTER — Other Ambulatory Visit: Payer: Self-pay

## 2020-10-22 VITALS — BP 161/82 | HR 99 | Ht 66.14 in | Wt 196.2 lb

## 2020-10-22 DIAGNOSIS — L821 Other seborrheic keratosis: Secondary | ICD-10-CM | POA: Diagnosis not present

## 2020-10-22 DIAGNOSIS — L989 Disorder of the skin and subcutaneous tissue, unspecified: Secondary | ICD-10-CM | POA: Insufficient documentation

## 2020-10-22 NOTE — Assessment & Plan Note (Signed)
2 lesions noted on left chest consistent with stuck-on appearance of SKs. Patient has no personal history of melanoma and the lesions appear more benign in nature. Does note a family history of unknown types of skin cancer. Cryotherapy completed in clinic today. - follow-up PRN

## 2020-10-22 NOTE — Progress Notes (Signed)
    SUBJECTIVE:   CHIEF COMPLAINT / HPI:   Patient reports that she has 2 moles that have turned a black color. She is unsure of when this happened, she just noted that they were all of a sudden present. She does not have any personal history of skin cancer, but does note that her sister and father have had skin cancer (unknown which specific types of cancer)  PERTINENT  PMH / PSH: Reviewed  OBJECTIVE:   BP (!) 161/82   Pulse 99   Ht 5' 6.14" (1.68 m)   Wt 196 lb 3.2 oz (89 kg)   SpO2 94%   BMI 31.53 kg/m   General: NAD, well-appearing, well-nourished Respiratory: No respiratory distress, breathing comfortably, able to speak in full sentences Skin: warm and dry, pigmented skin lesions noted on right side of chest wall with a stuck-on appearance as pictured below   Cryotherapy procedure: Freeze and thaw procedure x4.   ASSESSMENT/PLAN:   Seborrheic keratosis 2 lesions noted on left chest consistent with stuck-on appearance of SKs. Patient has no personal history of melanoma and the lesions appear more benign in nature. Does note a family history of unknown types of skin cancer. Cryotherapy completed in clinic today. - follow-up PRN     Rise Patience, [place]

## 2020-10-22 NOTE — Patient Instructions (Signed)
The lesions on your chest were frozen off with cryotherapy today. The area will likely blister up a bit and then the lesions should fall off. If they do not go away in the next several weeks then please return and we can remove them with another technique.

## 2020-10-23 ENCOUNTER — Encounter: Payer: Self-pay | Admitting: Genetic Counselor

## 2020-10-23 DIAGNOSIS — Z808 Family history of malignant neoplasm of other organs or systems: Secondary | ICD-10-CM | POA: Insufficient documentation

## 2020-10-23 DIAGNOSIS — Z860100 Personal history of colon polyps, unspecified: Secondary | ICD-10-CM | POA: Insufficient documentation

## 2020-10-23 NOTE — Progress Notes (Signed)
REFERRING PROVIDER: Lavena Bullion, DO [address] [place],  [place] [address]  PRIMARY PROVIDER:  Gerlene Fee, DO  PRIMARY REASON FOR VISIT:  1. History of colonic polyps   2. Family history of skin cancer      HISTORY OF PRESENT ILLNESS:   Laura Kaufman, a 65 y.o. female, was seen for a [place] cancer genetics consultation at the request of Dr. Bryan Lemma due to a personal history of colon polyps.  Laura Kaufman presents to clinic today to discuss the possibility of a hereditary predisposition to colon polyps and/or cancer, genetic testing, and to further clarify her future polyp/cancer risks, as well as potential polyp/cancer risks for family members.   Laura Kaufman does not have a personal history of cancer. Over her last two colonoscopies, she has had 23 colon polyps total, including adenomatous polyps.   RISK FACTORS:  Menarche was at age 25.  No live births.  OCP use for approximately 2 years.  Ovaries intact: no.  Hysterectomy: yes.  Menopausal status: postmenopausal.  HRT use: 0 years. Colonoscopy: yes;  23 cumulative polyps . Mammogram within the last year: no. Number of breast biopsies: 0. Any excessive radiation exposure in the past: no.  Past Medical History:  Diagnosis Date   Allergy    Family history of skin cancer    GERD (gastroesophageal reflux disease)    History of colonic polyps    Hypertension     Past Surgical History:  Procedure Laterality Date   ABDOMINAL HYSTERECTOMY     total   CATARACT EXTRACTION, BILATERAL     COLONOSCOPY  04/17/2019   ELBOW FRACTURE SURGERY Bilateral    KNEE SURGERY     WRIST SURGERY     no surgery per pt, but "popped bones back into place"    Social History   Socioeconomic History   Marital status: Unknown    Spouse name: Not on file   Number of children: Not on file   Years of education: Not on file   Highest education level: Not on file  Occupational History   Not on file  Tobacco  Use   Smoking status: Former   Smokeless tobacco: Never   Tobacco comments:    quit 20 years ago  Vaping Use   Vaping Use: Never used  Substance and Sexual Activity   Alcohol use: Not Currently   Drug use: Never   Sexual activity: Not Currently    Partners: Male    Birth control/protection: Post-menopausal  Other Topics Concern   Not on file  Social History Narrative   Not on file   Social Determinants of Health   Financial Resource Strain: Not on file  Food Insecurity: Not on file  Transportation Needs: Not on file  Physical Activity: Not on file  Stress: Not on file  Social Connections: Not on file     FAMILY HISTORY:  We obtained a detailed, 4-generation family history.  Significant diagnoses are listed below: Family History  Problem Relation Age of Onset   Colon cancer Mother 29   Skin cancer Father        dx 72s (multiple)   Skin cancer Sister        dx 5s   Cystic fibrosis Brother    Esophageal cancer Neg Hx    Stomach cancer Neg Hx    Rectal cancer Neg Hx    Colon polyps Neg Hx    Laura Kaufman does not have children. She had  one brother (deceased age 40 from cystic fibrosis) and one sister (age 72). Her sister has a history of skin cancer in her 15s.   Laura Kaufman mother died at age 36 without cancer. There were no maternal aunts/uncles. Laura Kaufman maternal grandparents died older than 55 without cancer.  Laura Kaufman father died at age 64 and had multiple skin cancers in his 71s. There was one paternal uncle who died in his 79s in a car accident, without cancer. Laura Kaufman paternal grandmother died at age 66 without cancer. She does not have any information about her paternal grandfather.  Laura Kaufman is unaware of a history of colon polyps in any of her relatives.   Laura Kaufman is unaware of previous family history of genetic testing for hereditary cancer risks. Patient's maternal ancestors are of [place] and [place] descent, and paternal ancestors  are of [place] and [place] descent. There is no reported Ashkenazi Jewish ancestry. There is no known consanguinity.  GENETIC COUNSELING ASSESSMENT: Laura Kaufman is a 65 y.o. female with a personal history of 23 colon polyps, which is somewhat suggestive of a hereditary polyposis syndrome and predisposition to cancer. We, therefore, discussed and recommended the following at today's visit.   DISCUSSION:  We discussed that polyps in general are common; however, most people have fewer than 5 lifetime polyps. When an individual has 10 or more polyps we become concerned about an underlying polyposis syndrome. The most common hereditary polyposis syndromes are Familial Adenomatous Polyposis (FAP), caused by mutations in the APC gene, and MUTYH-Associated Polyposis (MAP), caused by mutations in the MUTYH gene. There are other genes that are associated with polyposis, such as NTHL1 and MSH3. We discussed that testing is beneficial for several reasons, including knowing about cancer risks, identifying potential screening and risk-reduction options that may be appropriate, and to understand if other family members could be at risk for colon polyps and/or cancer and allow them to undergo genetic testing.   We reviewed the characteristics, features and inheritance patterns of hereditary polyposis/cancer syndromes. We also discussed genetic testing, including the appropriate family members to test, the process of testing, insurance coverage and turn-around-time for results. We discussed the implications of a negative, positive and/or variant of uncertain significant result. We recommended Laura Kaufman pursue genetic testing for the [facility] Common Hereditary Cancers + RNA panel.  The Common Hereditary Cancers Panel offered by [facility] includes sequencing and/or deletion duplication testing of the following 47genes: APC*, ATM*, AXIN2*, BARD1*, BMPR1A*, BRCA1*, BRCA2*, BRIP1*, CDH1*, CDK4, CDKN2A (p14ARF), CDKN2A (p16INK4a),  CHEK2*, CTNNA1*, DICER1*, EPCAM (Deletion/duplication testing only), GREM1 (promoter region deletion/duplication testing only), KIT, MEN1*, MLH1*, MSH2*, MSH3*, MSH6*, MUTYH*, NBN*, NF1*, NTHL1, PALB2*, PDGFRA, PMS2*, POLD1*, POLE*, PTEN*, RAD50*, RAD51C*, RAD51D*, SDHB*, SDHC*, SDHD*, SMAD4*, SMARCA4*, STK11*, TP53*, TSC1*, TSC2*, and VHL*.  The following genes were evaluated for sequence changes only: SDHA* and HOXB13 c.251G>A variant only. RNA analysis performed for * genes.  Based on Laura Kaufman's personal history of polyps, she meets medical criteria for genetic testing. Despite that she meets criteria, she may still have an out of pocket cost.   PLAN: After considering the risks, benefits, and limitations, Laura Kaufman provided informed consent to pursue genetic testing and the blood sample was sent to [facility] for analysis of the Common Hereditary Cancers + RNA panel. Results should be available within approximately two-three weeks' time, at which point they will be disclosed by telephone to Laura Kaufman, as will any additional recommendations warranted by these results. Laura Kaufman  will receive a summary of her genetic counseling visit and a copy of her results once available. This information will also be available in Epic.   Laura Kaufman questions were answered to her satisfaction today. Our contact information was provided should additional questions or concerns arise. Thank you for the referral and allowing [place] to share in the care of your patient.   Clint Guy, [place], [facility] Licensed, Certified Dispensing [email or web address] Phone: [phone]  The patient was seen for a total of 35 minutes in face-to-face genetic counseling. Patient was seen with her partner. This patient was discussed with Drs. Magrinat, Lindi Adie and/or Burr Medico who agrees with the above.    _______________________________________________________________________ For Office Staff:  Number of  people involved in session: 1 Was an Intern/ student involved with case: no

## 2020-10-26 ENCOUNTER — Encounter: Payer: Self-pay | Admitting: Family Medicine

## 2020-10-27 MED ORDER — SHINGRIX 50 MCG/0.5ML IM SUSR: 0.5000 mL | Freq: Once | INTRAMUSCULAR | 0 refills | Status: AC

## 2020-11-14 DIAGNOSIS — Z1379 Encounter for other screening for genetic and chromosomal anomalies: Secondary | ICD-10-CM | POA: Insufficient documentation

## 2020-11-17 ENCOUNTER — Ambulatory Visit: Payer: Self-pay | Admitting: Genetic Counselor

## 2020-11-17 ENCOUNTER — Encounter: Payer: Self-pay | Admitting: Genetic Counselor

## 2020-11-17 ENCOUNTER — Telehealth: Payer: Self-pay | Admitting: Genetic Counselor

## 2020-11-17 DIAGNOSIS — Z1379 Encounter for other screening for genetic and chromosomal anomalies: Secondary | ICD-10-CM

## 2020-11-17 NOTE — Telephone Encounter (Signed)
Revealed negative genetic testing. Discussed that we do not know why she has had many colon polyps. It is possible that there could be a mutation in a different gene that we are not testing, or our current technology may not be able to detect certain mutations. It will therefore be important for her to stay in contact with genetics to keep up with whether additional testing may be appropriate in the future.   Two variants of uncertain significance were detected - one in the KIT gene called c.1227_1228delinsAT (p.Asn410Tyr) and a second in the MLH1 gene called c.2172_2173delinsAG (p.Arg725Gly). Her result is still considered normal at this time and should not impact her medical management.

## 2020-11-17 NOTE — Progress Notes (Signed)
HPI:  Ms. Balsley was previously seen in the [facility] clinic due to a personal history of colon polyps and concerns regarding a hereditary predisposition to cancer. Please refer to our prior cancer genetics clinic note for more information regarding our discussion, assessment and recommendations, at the time. Ms. Daywalt recent genetic test results were disclosed to her, as were recommendations warranted by these results. These results and recommendations are discussed in more detail below.  FAMILY HISTORY:  We obtained a detailed, 4-generation family history.  Significant diagnoses are listed below: Family History  Problem Relation Age of Onset   Colon cancer Mother 62   Skin cancer Father        dx 34s (multiple)   Skin cancer Sister        dx 80s   Cystic fibrosis Brother    Esophageal cancer Neg Hx    Stomach cancer Neg Hx    Rectal cancer Neg Hx    Colon polyps Neg Hx    Ms. Nesby does not have children. She had one brother (deceased age 66 from cystic fibrosis) and one sister (age 46). Her sister has a history of skin cancer in her 56s.    Ms. Lewter mother died at age 59 without cancer. There were no maternal aunts/uncles. Ms. Cichowski maternal grandparents died older than 71 without cancer.   Ms. Croll father died at age 59 and had multiple skin cancers in his 32s. There was one paternal uncle who died in his 78s in a car accident, without cancer. Ms. Goosby paternal grandmother died at age 41 without cancer. She does not have any information about her paternal grandfather.   Ms. Derrick is unaware of a history of colon polyps in any of her relatives.    Ms. Dulski is unaware of previous family history of genetic testing for hereditary cancer risks. Patient's maternal ancestors are of [place] descent, and paternal ancestors are of [place] and [place] descent. There is no reported Ashkenazi Jewish ancestry. There is no known  consanguinity.  GENETIC TEST RESULTS: Genetic testing reported out on 11/14/2020 through the [facility] Common Hereditary Cancers + RNA panel. No pathogenic variants were detected.   The Common Hereditary Cancers Panel offered by [facility] includes sequencing and/or deletion duplication testing of the following 47genes: APC*, ATM*, AXIN2*, BARD1*, BMPR1A*, BRCA1*, BRCA2*, BRIP1*, CDH1*, CDK4, CDKN2A (p14ARF), CDKN2A (p16INK4a), CHEK2*, CTNNA1*, DICER1*, EPCAM (Deletion/duplication testing only), GREM1 (promoter region deletion/duplication testing only), KIT, MEN1*, MLH1*, MSH2*, MSH3*, MSH6*, MUTYH*, NBN*, NF1*, NTHL1, PALB2*, PDGFRA, PMS2*, POLD1*, POLE*, PTEN*, RAD50*, RAD51C*, RAD51D*, SDHB*, SDHC*, SDHD*, SMAD4*, SMARCA4*, STK11*, TP53*, TSC1*, TSC2*, and VHL*.  The following genes were evaluated for sequence changes only: SDHA* and HOXB13 c.251G>A variant only. RNA analysis performed for * genes. The test report will be scanned into EPIC and located under the Molecular Pathology section of the Results Review tab.  A portion of the result report is included below for reference.     We discussed with Ms. Logie that because current genetic testing is not perfect, it is possible there may be a gene mutation in one of these genes that current testing cannot detect, but that chance is small. We also discussed that there could be another gene that has not yet been discovered, or that we have not yet tested, that is responsible for her personal history of colon polyps. Therefore, it is important to remain in touch with cancer genetics in the future so that we can continue to  offer Ms. Pederson the most up to date genetic testing.   Genetic testing did identify two variants of uncertain significance (VUS) - one in the KIT gene called c.1227_1228delinsAT (p.Asn410Tyr) and a second in the MLH1 gene called c.2172_2173delinsAG (p.Arg725Gly). At this time, it is unknown if these variants are associated with increased  cancer risk or if they are normal findings, but most variants such as these get reclassified to being inconsequential. They should not be used to make medical management decisions. With time, we suspect the lab will determine the significance of these variants, if any. If we do learn more about them, we will try to contact Ms. Motter to discuss it further. However, it is important to stay in touch with [place] periodically and keep the address and phone number up to date.  ADDITIONAL GENETIC TESTING: We discussed with Ms. Bonzo that her genetic testing was fairly extensive. If there are genes identified to increase cancer risk that can be analyzed in the future, we would be happy to discuss and coordinate this testing at that time.    CANCER SCREENING RECOMMENDATIONS: Ms. Huante test result is considered negative (normal). This means that we have not identified a hereditary cause for her personal history of colon polyps at this time. While reassuring, this does not definitively rule out a hereditary predisposition to colon polyps/cancer. It is still possible that there could be genetic mutations that are undetectable by current technology. There could be genetic mutations in genes that have not been tested or identified to increase cancer risk. Therefore, it is recommended she continue to follow the cancer management and screening guidelines provided by her GI and primary healthcare provider.   An individual's cancer risk and medical management are not determined by genetic test results alone. Overall cancer risk assessment incorporates additional factors, including personal medical history, family history, and any available genetic information that may result in a personalized plan for cancer prevention and surveillance.  Colon Polyp Risk: According to NCCN Guidelines (Genetic/Familial High-Risk Assessment: Colorectal Version 1.2022), individuals who have had more than 20 but less than 100 adenomas and  normal genetic testing should have a colonoscopy and polypectomy every 1-2 years. These individuals may also consider a baseline upper endoscopy (including complete visualization of the ampulla of Vater) and repeat as indicated.  Breast Cancer Risk: Based on Ms. Sanagustin's personal and family history, as well as her genetic test results, the Tyrer-Cuzick breast cancer risk model was used to estimate her risk of developing breast cancer. Tyrer-Cuzick estimates her lifetime risk of developing breast cancer to be approximately 5.6%. This lifetime breast cancer risk is a preliminary estimate based on available information using one of several models endorsed by the [employer] (ACS). The ACS recommends consideration of breast MRI screening as an adjunct to mammography for patients at high risk (defined as 20% or greater lifetime risk). A more detailed breast cancer risk assessment can be considered, if clinically indicated. This risk estimate can change over time and may be repeated to reflect new information in her personal or family history in the future.     RECOMMENDATIONS FOR FAMILY MEMBERS:  Individuals in this family might be at some increased risk of developing cancer, over the general population risk, simply due to the family history of cancer.  We recommended women in this family have a yearly mammogram beginning at age 44, or 39 years younger than the earliest onset of cancer, an annual clinical breast exam, and perform monthly breast self-exams.  Women in this family should also have a gynecological exam as recommended by their primary provider. All family members should be referred for colonoscopy as directed by their doctors.  FOLLOW-UP: Lastly, we discussed with Ms. Birkland that cancer genetics is a rapidly advancing field and it is possible that new genetic tests will be appropriate for her and/or her family members in the future. We encouraged her to remain in contact with cancer  genetics on an annual basis so we can update her personal and family histories and let her know of advances in cancer genetics that may benefit this family.   Our contact number was provided. Ms. Decamp questions were answered to her satisfaction, and she knows she is welcome to call us at anytime with additional questions or concerns.   Clint Guy, MS, [facility] Genetic Counselor [email or web address] Phone: [phone]

## 2020-11-17 NOTE — Telephone Encounter (Signed)
LVM that her genetic test results are available and requested that she call back to discuss them.  

## 2020-12-15 ENCOUNTER — Other Ambulatory Visit: Payer: Self-pay | Admitting: Family Medicine

## 2021-01-11 ENCOUNTER — Telehealth: Payer: Self-pay | Admitting: Hematology and Oncology

## 2021-01-11 NOTE — Telephone Encounter (Signed)
Rescheduled 11/16 appointments due to provider pal, patient has been called and notified.

## 2021-01-27 ENCOUNTER — Encounter: Payer: Self-pay | Admitting: Family Medicine

## 2021-01-27 DIAGNOSIS — Z87898 Personal history of other specified conditions: Secondary | ICD-10-CM

## 2021-01-29 DIAGNOSIS — E663 Overweight: Secondary | ICD-10-CM | POA: Diagnosis not present

## 2021-01-29 DIAGNOSIS — I10 Essential (primary) hypertension: Secondary | ICD-10-CM | POA: Diagnosis not present

## 2021-01-29 DIAGNOSIS — M1990 Unspecified osteoarthritis, unspecified site: Secondary | ICD-10-CM | POA: Diagnosis not present

## 2021-01-29 DIAGNOSIS — Z808 Family history of malignant neoplasm of other organs or systems: Secondary | ICD-10-CM | POA: Diagnosis not present

## 2021-01-29 DIAGNOSIS — Z8349 Family history of other endocrine, nutritional and metabolic diseases: Secondary | ICD-10-CM | POA: Diagnosis not present

## 2021-01-29 DIAGNOSIS — Z880 Allergy status to penicillin: Secondary | ICD-10-CM | POA: Diagnosis not present

## 2021-01-29 DIAGNOSIS — Z87891 Personal history of nicotine dependence: Secondary | ICD-10-CM | POA: Diagnosis not present

## 2021-02-03 ENCOUNTER — Inpatient Hospital Stay: Payer: Medicare HMO | Attending: Hematology

## 2021-02-03 ENCOUNTER — Inpatient Hospital Stay: Payer: Medicare HMO | Admitting: Hematology

## 2021-02-03 ENCOUNTER — Encounter: Payer: Self-pay | Admitting: Hematology

## 2021-02-03 ENCOUNTER — Other Ambulatory Visit: Payer: Self-pay

## 2021-02-03 VITALS — BP 144/101 | HR 107 | Temp 98.2°F | Resp 19 | Ht 66.14 in | Wt 200.0 lb

## 2021-02-03 DIAGNOSIS — I10 Essential (primary) hypertension: Secondary | ICD-10-CM | POA: Diagnosis not present

## 2021-02-03 DIAGNOSIS — Z79899 Other long term (current) drug therapy: Secondary | ICD-10-CM | POA: Diagnosis not present

## 2021-02-03 DIAGNOSIS — Z8719 Personal history of other diseases of the digestive system: Secondary | ICD-10-CM | POA: Diagnosis not present

## 2021-02-03 DIAGNOSIS — D751 Secondary polycythemia: Secondary | ICD-10-CM | POA: Insufficient documentation

## 2021-02-03 LAB — CBC WITH DIFFERENTIAL/PLATELET
Abs Immature Granulocytes: 0.05 10*3/uL (ref 0.00–0.07)
Basophils Absolute: 0.1 10*3/uL (ref 0.0–0.1)
Basophils Relative: 1 %
Eosinophils Absolute: 0.1 10*3/uL (ref 0.0–0.5)
Eosinophils Relative: 1 %
HCT: 45.5 % (ref 36.0–46.0)
Hemoglobin: 15 g/dL (ref 12.0–15.0)
Immature Granulocytes: 1 %
Lymphocytes Relative: 27 %
Lymphs Abs: 2.7 10*3/uL (ref 0.7–4.0)
MCH: 27.6 pg (ref 26.0–34.0)
MCHC: 33 g/dL (ref 30.0–36.0)
MCV: 83.6 fL (ref 80.0–100.0)
Monocytes Absolute: 0.6 10*3/uL (ref 0.1–1.0)
Monocytes Relative: 6 %
Neutro Abs: 6.5 10*3/uL (ref 1.7–7.7)
Neutrophils Relative %: 64 %
Platelets: 250 10*3/uL (ref 150–400)
RBC: 5.44 MIL/uL — ABNORMAL HIGH (ref 3.87–5.11)
RDW: 12.8 % (ref 11.5–15.5)
WBC: 10.1 10*3/uL (ref 4.0–10.5)
nRBC: 0 % (ref 0.0–0.2)

## 2021-02-03 NOTE — Progress Notes (Signed)
[name]   [phone] [phone]   Clinic Follow up Note   Patient Care Team: Gerlene Fee, DO as PCP - General (Family Medicine)  Date of Service:  02/03/2021  CHIEF COMPLAINT: f/u of polycythemia  CURRENT THERAPY:  Observation  ASSESSMENT & PLAN:  Laura Kaufman is a 65 y.o. female with   1. Secondary polycythemia -ongoing for years with hg of around 15-16 -referred to Dr. Chryl Heck 07/16/20 -myeloproliferative work-up showed no evidence of myeloid neoplasm mutation. EPO level normal  -she was advised to give blood once or twice a year. She last donated in 12/2020. -hg down to 15, HCT at 45.5 today (02/03/21).  -given her good response to just blood donation, we will f/u PRN. She knows to monitor her hemoglobin and HCT and will contact us if these become persistently elevated.  2. Genetic Testing -she was referred to genetics on 10/20/20 for history of colon polyps. -results were negative, with two VUS (KIT and MLH1)   PLAN: -f/u PRN   No problem-specific Assessment & Plan notes found for this encounter.   INTERVAL HISTORY:  Laura Kaufman is here for a follow up of polycythemia. She was last seen by Dr. Chryl Heck on 08/06/20. She presents to the clinic accompanied by her fiance, Laura Kaufman. She reports she is feeling well overall. She notes she looked online and found she should stop eating grits. She also reports she gave blood last month.    All other systems were reviewed with the patient and are negative.  MEDICAL HISTORY:  Past Medical History:  Diagnosis Date   Allergy    Family history of skin cancer    GERD (gastroesophageal reflux disease)    History of colonic polyps    Hypertension     SURGICAL HISTORY: Past Surgical History:  Procedure Laterality Date   ABDOMINAL HYSTERECTOMY     total   CATARACT EXTRACTION, BILATERAL     COLONOSCOPY  04/17/2019   ELBOW FRACTURE SURGERY Bilateral    KNEE SURGERY     WRIST  SURGERY     no surgery per pt, but "popped bones back into place"    I have reviewed the social history and family history with the patient and they are unchanged from previous note.  ALLERGIES:  is allergic to bee venom and penicillins.  MEDICATIONS:  Current Outpatient Medications  Medication Sig Dispense Refill   losartan (COZAAR) 25 MG tablet TAKE 1 TABLET BY MOUTH DAILY 90 tablet 0   Turmeric 500 MG TABS Take by mouth daily.     No current facility-administered medications for this visit.    PHYSICAL EXAMINATION: ECOG PERFORMANCE STATUS: 0 - Asymptomatic  Vitals:   02/03/21 1325  BP: (!) 144/101  Pulse: (!) 107  Resp: 19  Temp: 98.2 F (36.8 C)  SpO2: 98%   Wt Readings from Last 3 Encounters:  02/03/21 200 lb (90.7 kg)  10/22/20 196 lb 3.2 oz (89 kg)  10/06/20 197 lb (89.4 kg)     GENERAL:alert, no distress and comfortable SKIN: skin color normal, no rashes or significant lesions EYES: normal, Conjunctiva are pink and non-injected, sclera clear  NEURO: alert & oriented x 3 with fluent speech  LABORATORY DATA:  I have reviewed the data as listed CBC Latest Ref Rng & Units 02/03/2021 07/16/2020 07/14/2020  WBC 4.0 - 10.5 K/uL 10.1 8.2 9.2  Hemoglobin 12.0 - 15.0 g/dL 15.0 16.5(H) 16.5(H)  Hematocrit 36.0 - 46.0 % 45.5 48.5(H) 49.0(H)  Platelets 150 -  400 K/uL 250 265 260     CMP Latest Ref Rng & Units 07/16/2020 07/06/2020 06/29/2020  Glucose 70 - 99 mg/dL 102(H) 118(H) 102(H)  BUN 8 - 23 mg/dL _0 Creatinine 0.44 - 1.00 mg/dL 0.78 0.89 0.89  Sodium 135 - 145 mmol/L 142 142 143  Potassium 3.5 - 5.1 mmol/L 4.3 4.5 4.1  Chloride 98 - 111 mmol/L 106 102 104  CO2 22 - 32 mmol/L 23 21 18(L)  Calcium 8.9 - 10.3 mg/dL 10.3 9.9 10.2  Total Protein 6.5 - 8.1 g/dL 7.8 - -  Total Bilirubin 0.3 - 1.2 mg/dL 0.5 - -  Alkaline Phos 38 - 126 U/L 142(H) - -  AST 15 - 41 U/L 40 - -  ALT 0 - 44 U/L 57(H) - -      RADIOGRAPHIC STUDIES: I have personally reviewed  the radiological images as listed and agreed with the findings in the report. No results found.    No orders of the defined types were placed in this encounter.  All questions were answered. The patient knows to call the clinic with any problems, questions or concerns. No barriers to learning was detected. The total time spent in the appointment was 15 minutes.     Truitt Merle, MD 02/03/2021   I, Wilburn Mylar, am acting as scribe for Truitt Merle, MD.   I have reviewed the above documentation for accuracy and completeness, and I agree with the above.

## 2021-02-04 ENCOUNTER — Ambulatory Visit: Payer: Medicare HMO | Admitting: Hematology and Oncology

## 2021-02-04 ENCOUNTER — Other Ambulatory Visit: Payer: Medicare HMO

## 2021-02-26 MED ORDER — SCOPOLAMINE 1 MG/3DAYS TD PT72: 1.0000 | MEDICATED_PATCH | TRANSDERMAL | 0 refills | Status: DC

## 2021-04-05 ENCOUNTER — Encounter: Payer: Self-pay | Admitting: Family Medicine

## 2021-04-13 ENCOUNTER — Telehealth: Payer: Self-pay

## 2021-04-13 NOTE — Telephone Encounter (Signed)
Patient calls nurse line reporting cold symptoms for ~ 2 weeks. She reports she got sick on a cruise ~ 2 weeks go and symptoms have not subsided. Cough, congestion, SOB, fatigue and loss of appetite/loss of weight. She denies GI upset, fever/chills or body aches. She reports negative covid test a few days ago.   Scheduled for evaluation tomorrow.   Red flags given in the meantime.

## 2021-04-14 ENCOUNTER — Ambulatory Visit (INDEPENDENT_AMBULATORY_CARE_PROVIDER_SITE_OTHER): Payer: Medicare HMO | Admitting: Family Medicine

## 2021-04-14 ENCOUNTER — Other Ambulatory Visit: Payer: Self-pay

## 2021-04-14 VITALS — BP 146/104 | HR 116 | Temp 98.9°F | Ht 66.0 in | Wt 192.4 lb

## 2021-04-14 DIAGNOSIS — R051 Acute cough: Secondary | ICD-10-CM

## 2021-04-14 DIAGNOSIS — J189 Pneumonia, unspecified organism: Secondary | ICD-10-CM | POA: Diagnosis not present

## 2021-04-14 MED ORDER — DOXYCYCLINE HYCLATE 100 MG PO TABS: 100.0000 mg | ORAL_TABLET | Freq: Two times a day (BID) | ORAL | 0 refills | Status: DC

## 2021-04-14 MED ORDER — DOXYCYCLINE HYCLATE 100 MG PO TABS: 100.0000 mg | ORAL_TABLET | Freq: Two times a day (BID) | ORAL | 0 refills | Status: AC

## 2021-04-14 NOTE — Patient Instructions (Signed)
Stop by the pharmacy to pick up your antibiotics.   We will contact you if your COVID test is positive.  Please quarantine while you wait for the results.  If your test is negative you may resume normal activities.  If your test is positive please continue to quarantine for at least 5 days from your symptom onset or until you are without a fever for at least 24 hours after the medications.   You can take Tylenol and/or Ibuprofen as needed for fever reduction and pain relief.    For cough: honey 1/2 to 1 teaspoon (you can dilute the honey in water or another fluid).  You can also use guaifenesin and dextromethorphan for cough. You can use a humidifier for chest congestion and cough.  If you don't have a humidifier, you can sit in the bathroom with the hot shower running.      For sore throat: try warm salt water gargles, cepacol lozenges, throat spray, warm tea or water with lemon/honey, popsicles or ice, or OTC cold relief medicine for throat discomfort.    For congestion: take a daily anti-histamine like Zyrtec, Claritin, and a oral decongestant, such as pseudoephedrine.  You can also use Flonase 1-2 sprays in each nostril daily.    It is important to stay hydrated: drink plenty of fluids (water, gatorade/powerade/pedialyte, juices, or teas) to keep your throat moisturized and help further relieve irritation/discomfort.    Return or go to the Emergency Department if symptoms worsen or do not improve after completing your antibiotics.

## 2021-04-14 NOTE — Progress Notes (Signed)
[facility]cycline   SUBJECTIVE:   CHIEF COMPLAINT / HPI:   Laura Kaufman is a 66 y.o. female here for ongoing nasal congestion and cough for the past 12 days. Here with husband recently returned from a trip to [place]. Notes their was a person on their floor that was coughing. Has been taking Claritin and Robitussin without relief.  Cough is waking her up from her sleep. She feels as if symptoms are worsening. Has headache and shortness of breath . Denies known fever, vomiting, diarrhea, chest pain.  She is covid vaccinated and boosted.      PERTINENT  PMH / PSH: reviewed and updated as appropriate   OBJECTIVE:   BP (!) 146/104    Pulse (!) 116    Temp 98.9 F (37.2 C) (Oral)    Ht 5\' 6"  (1.676 m)    Wt 192 lb 6 oz (87.3 kg)    SpO2 96%    BMI 31.05 kg/m   Repeat HR 115    GEN:     alert, ill-appearing female  HENT:  :  mucus membranes moist, oropharyngeal without lesions, +turbinate hypertrophy, no nasal discharge, bilateral TM normal EYES:   pupils equal and reactive, no scleral injection NECK:  normal ROM, no cervical lymphadenopathy  RESP:  clear to auscultation bilaterally, no wheezing, no rales, + rhonchi  CVS:   tachycardic, regular rhythm Skin:   warm and dry, no rash on visible skin    ASSESSMENT/PLAN:    Cough   Nasal Congestion : Viral Illness vs CAP  Patient with 12 days of respiratory symptoms. She is ill appearing. Afebrile but tachycardic. May be an element of dehydration given history of decreased appetite or fever masked by OTC cough medications.  Tested for COVID, RSV and influenza with respiratory swab. Husbands POC influenza was negative. Will treat for community acquired pneumonia.  - continue Tylenol or Motrin as needed for discomfort - nasal saline to help with his nasal congestion - Use a cool mist humidifier at bedtime to help with breathing - Stressed hydration - Discussed return and ED precautions, pt voiced understanding.    Lyndee Hensen, DO PGY-3, [facility] 04/15/2021

## 2021-04-15 ENCOUNTER — Encounter: Payer: Self-pay | Admitting: Family Medicine

## 2021-04-16 LAB — COVID-19, FLU A+B AND RSV
Influenza A, NAA: NOT DETECTED
Influenza B, NAA: NOT DETECTED
RSV, NAA: NOT DETECTED
SARS-CoV-2, NAA: NOT DETECTED

## 2021-04-23 ENCOUNTER — Other Ambulatory Visit: Payer: Self-pay | Admitting: Family Medicine

## 2021-06-21 ENCOUNTER — Other Ambulatory Visit: Payer: Self-pay | Admitting: Family Medicine

## 2021-06-21 MED ORDER — LOSARTAN POTASSIUM 25 MG PO TABS: 25.0000 mg | ORAL_TABLET | Freq: Every day | ORAL | 3 refills | Status: DC

## 2021-08-20 DIAGNOSIS — Z6831 Body mass index (BMI) 31.0-31.9, adult: Secondary | ICD-10-CM | POA: Diagnosis not present

## 2021-08-20 DIAGNOSIS — Z880 Allergy status to penicillin: Secondary | ICD-10-CM | POA: Diagnosis not present

## 2021-08-20 DIAGNOSIS — Z87892 Personal history of anaphylaxis: Secondary | ICD-10-CM | POA: Diagnosis not present

## 2021-08-20 DIAGNOSIS — K219 Gastro-esophageal reflux disease without esophagitis: Secondary | ICD-10-CM | POA: Diagnosis not present

## 2021-08-20 DIAGNOSIS — Z87891 Personal history of nicotine dependence: Secondary | ICD-10-CM | POA: Diagnosis not present

## 2021-08-20 DIAGNOSIS — E669 Obesity, unspecified: Secondary | ICD-10-CM | POA: Diagnosis not present

## 2021-08-20 DIAGNOSIS — Z008 Encounter for other general examination: Secondary | ICD-10-CM | POA: Diagnosis not present

## 2021-08-20 DIAGNOSIS — Z91038 Other insect allergy status: Secondary | ICD-10-CM | POA: Diagnosis not present

## 2021-08-20 DIAGNOSIS — Z8249 Family history of ischemic heart disease and other diseases of the circulatory system: Secondary | ICD-10-CM | POA: Diagnosis not present

## 2021-08-20 DIAGNOSIS — M1990 Unspecified osteoarthritis, unspecified site: Secondary | ICD-10-CM | POA: Diagnosis not present

## 2021-08-20 DIAGNOSIS — Z836 Family history of other diseases of the respiratory system: Secondary | ICD-10-CM | POA: Diagnosis not present

## 2021-08-20 DIAGNOSIS — I10 Essential (primary) hypertension: Secondary | ICD-10-CM | POA: Diagnosis not present

## 2021-08-23 ENCOUNTER — Other Ambulatory Visit: Payer: Self-pay | Admitting: Family Medicine

## 2021-08-23 DIAGNOSIS — Z1231 Encounter for screening mammogram for malignant neoplasm of breast: Secondary | ICD-10-CM

## 2021-08-24 ENCOUNTER — Encounter: Payer: Self-pay | Admitting: *Deleted

## 2021-08-31 ENCOUNTER — Ambulatory Visit
Admission: RE | Admit: 2021-08-31 | Discharge: 2021-08-31 | Disposition: A | Discharge status: Home / Self Care | Payer: Medicare HMO | Source: Ambulatory Visit | Attending: Family Medicine | Admitting: Family Medicine

## 2021-08-31 DIAGNOSIS — Z1231 Encounter for screening mammogram for malignant neoplasm of breast: Secondary | ICD-10-CM | POA: Diagnosis not present

## 2021-10-28 ENCOUNTER — Ambulatory Visit: Payer: Medicare HMO | Admitting: Family Medicine

## 2021-10-28 ENCOUNTER — Ambulatory Visit: Payer: Medicare HMO | Admitting: Student

## 2021-11-05 ENCOUNTER — Encounter: Payer: Self-pay | Admitting: Student

## 2021-11-05 ENCOUNTER — Ambulatory Visit (INDEPENDENT_AMBULATORY_CARE_PROVIDER_SITE_OTHER): Payer: Medicare HMO | Admitting: Student

## 2021-11-05 VITALS — BP 143/98 | HR 90 | Ht 66.0 in | Wt 197.2 lb

## 2021-11-05 DIAGNOSIS — Z713 Dietary counseling and surveillance: Secondary | ICD-10-CM | POA: Diagnosis not present

## 2021-11-05 DIAGNOSIS — Z6831 Body mass index (BMI) 31.0-31.9, adult: Secondary | ICD-10-CM

## 2021-11-05 DIAGNOSIS — I10 Essential (primary) hypertension: Secondary | ICD-10-CM

## 2021-11-05 MED ORDER — WEGOVY 0.25 MG/0.5ML ~~LOC~~ SOAJ: 0.2500 mg | SUBCUTANEOUS | 0 refills | Status: DC

## 2021-11-05 NOTE — Assessment & Plan Note (Addendum)
Elevated initially in the office today. Rechecked at 126/84 - continue Cozaar 25 mg daily

## 2021-11-05 NOTE — Progress Notes (Signed)
    SUBJECTIVE:   CHIEF COMPLAINT / HPI:   Laura Kaufman is a 66 y.o. female presenting today for weight loss counseling. She has a previous history of a broken patella which now bothers her due to her weight. She has tried dietary changes and exercise with no significant weight loss. Denies personal or family history of medullary thyroid cancer and MEN.   PERTINENT  PMH / PSH:  HTN 25 mg Cozaar  Hx L1 vertebral fx Osteoporosis, DEXA 06/2019, Spine L1-L4 T-score of -3.1, left femur -2.6, repeat every 2 years.  Polycythemia: controlled by blood donation biannually, monitored by oncology    OBJECTIVE:   BP (!) 143/98   Pulse 90   Ht '5\' 6"'$  (1.676 m)   Wt 197 lb 4 oz (89.5 kg)   SpO2 97%   BMI 31.84 kg/m   Well appearing in NAD Cardio: regular rate and rhythm, no murmurs  Pulm: clear, no wheezing  Abdomen: soft, non-tender  Extremities: no peripheral edema   ASSESSMENT/PLAN:   HTN (hypertension) Elevated initially in the office today. Rechecked at 126/84 - continue Cozaar 25 mg daily  Encounter for weight loss counseling [name] has tried for several months to lose weight with dietary changes and exercise with no significant weight loss. She has no personal or family History of medullary thyroid cancer or MEN.  - prescribe 0.25 Wegovy injections weekly for 4 weeks  - titrate up as appropriate - continue good lifestyle choices by eating well balanced meals and exercising frequently.     Darci Current, [place]

## 2021-11-05 NOTE — Assessment & Plan Note (Signed)
Laura Kaufman has tried for several months to lose weight with dietary changes and exercise with no significant weight loss. She has no personal or family History of medullary thyroid cancer or MEN.  - prescribe 0.25 Wegovy injections weekly for 4 weeks  - titrate up as appropriate - continue good lifestyle choices by eating well balanced meals and exercising frequently.

## 2021-12-20 ENCOUNTER — Encounter: Payer: Self-pay | Admitting: Hematology

## 2021-12-22 ENCOUNTER — Ambulatory Visit (INDEPENDENT_AMBULATORY_CARE_PROVIDER_SITE_OTHER): Payer: Medicare HMO | Admitting: Student

## 2021-12-22 ENCOUNTER — Other Ambulatory Visit (HOSPITAL_COMMUNITY): Payer: Self-pay

## 2021-12-22 ENCOUNTER — Telehealth: Payer: Self-pay | Admitting: *Deleted

## 2021-12-22 VITALS — BP 130/70 | HR 74 | Ht 66.0 in | Wt 194.0 lb

## 2021-12-22 DIAGNOSIS — Z713 Dietary counseling and surveillance: Secondary | ICD-10-CM

## 2021-12-22 DIAGNOSIS — L821 Other seborrheic keratosis: Secondary | ICD-10-CM | POA: Diagnosis not present

## 2021-12-22 NOTE — Assessment & Plan Note (Addendum)
Patient with 4 lesions that have stuck on appearance, most concerning for SK. Lesions have been slow growing without irritation. Lesions are well demarcated, raised, and hyperpigmented. Patient has father who had unknown type of skin cancer, but spent a lot of time in the sun. Given appearance of lesions, low concern for melanoma or AK. Patient informed of benign nature of lesions and still wanting cryotherapy for removal.  -F/u appointment in [place] clinic for cryotherapy

## 2021-12-22 NOTE — Telephone Encounter (Signed)
Pt is here for a derm appt today.  She wants to let Dr. Sabra Heck know that the Mancel Parsons is not covered by her insurance and is to expensive.    She would like to know the next steps or alternatives. Christen Bame, CMA

## 2021-12-22 NOTE — Patient Instructions (Signed)
It was great to see you! Thank you for allowing me to participate in your care! It looks like your moles are Seborrheic Keratosis. These are benign lesions and have no risk of developing cancer.  Our plans for today:   - Seborrheic Keratosis  These can be frozen off for cosmetic reasons.   Appointment scheduled tomorrow, 12/23/21 @ 1:30 pm  Take care and seek immediate care sooner if you develop any concerns.   Dr. Holley Bouche, MD [place]

## 2021-12-22 NOTE — Progress Notes (Signed)
  SUBJECTIVE:   CHIEF COMPLAINT / HPI:   Has had bumps on skin. Father had skin cancer and was dx 5 years before he died, was out in the sun a lot. Patient notes she hasn't been in the sun much but is now wanting to be more active, now that she has a pool. Denies any itching/irritation of lesions. Often worries about cancer risk with lesions.  One on back of leg: 2 years, noticed it in shower, unsure if changing in size One on front of arm: 6 months, has gotten bigger. Two on face (eyebrow and cheek): showed up a couple weeks ago, has hit it while washing, noticed that it was bleeding at one point.    PERTINENT  PMH / PSH:    OBJECTIVE:  BP 130/70   Pulse 74   Ht '5\' 6"'$  (1.676 m)   Wt 194 lb (88 kg)   SpO2 98%   BMI 31.31 kg/m   General: NAD, pleasant, able to participate in exam Skin: warm and dry, multiple bown/hyper pigmented nodules under a centimeter in diameter, well demarcated and raised, regular borders, monochromatic Neuro: alert, no obvious focal deficits, speech normal Psych: Normal affect and mood  ASSESSMENT/PLAN:  Seborrheic keratosis Patient with 4 lesions that have stuck on appearance, most concerning for SK. Lesions have been slow growing without irritation. Lesions are well demarcated, raised, and hyperpigmented. Patient has father who had unknown type of skin cancer, but spent a lot of time in the sun. Given appearance of lesions, low concern for melanoma or AK. Patient informed of benign nature of lesions and still wanting cryotherapy for removal.  -F/u appointment in [place] [facility] for cryotherapy          No orders of the defined types were placed in this encounter.  No orders of the defined types were placed in this encounter.  No follow-ups on file. '@SIGNNOTE'$ @

## 2021-12-23 ENCOUNTER — Ambulatory Visit (HOSPITAL_BASED_OUTPATIENT_CLINIC_OR_DEPARTMENT_OTHER): Payer: Medicare HMO | Admitting: Student

## 2021-12-23 DIAGNOSIS — L821 Other seborrheic keratosis: Secondary | ICD-10-CM | POA: Diagnosis not present

## 2021-12-23 NOTE — Telephone Encounter (Signed)
Spoke with Lamis about weight loss drug Wegovy not being covered by insurance and next steps for weight loss. She is amendable to talking to our nutritionist Dr. Jenne Campus about weight loss options.   Referral order placed.   Darci Current, DO [facility], PGY-1 12/23/21 11:07 AM

## 2021-12-23 NOTE — Progress Notes (Signed)
  SUBJECTIVE:   CHIEF COMPLAINT / HPI:   Wanting to have Sk's removed on face, left leg, and left arm. Patient seen in clinic yesterday and sent to [facility] Procedure clinic.    OBJECTIVE:  BP (!) 144/88   Pulse 94   Ht '5\' 6"'$  (1.676 m)   Wt 194 lb (88 kg)   SpO2 99%   BMI 31.31 kg/m   Skin: warm and dry, multiple hyperpigmented patches with well circumscribed and elevated.      ASSESSMENT/PLAN:  Seborrheic keratosis Diagnosis: SK Procedure: Cryotherapy Location: Face, Arm, Leg  After discussion of the risks, benefits, and alternative therapies available, the patient elected to proceed. After obtaining written informed consent, the patient's identity, procedure, and site were verified during a time out prior to proceeding procedure. The lesions on the face, arm, and leg were treated using liquid nitrogen spray gun for 6 second per cycle, 3 cycles total. The patient tolerated the procedure well and there were no immediate complications.  Patient was provided aftercare handout and advised to return if lesion(s) did not fully resolved.  Holley Bouche, MD

## 2021-12-23 NOTE — Patient Instructions (Signed)
It was great to see you! Thank you for allowing me to participate in your care!  We saw you for cryotherapy of several Seborrheic Keratosis (SK) lesions. See handout for instructions on management.  Our plans for today:  - Cryotherapy of SK's  Take care and seek immediate care sooner if you develop any concerns.   Dr. Holley Bouche, MD [place]

## 2021-12-23 NOTE — Assessment & Plan Note (Signed)
Diagnosis: SK Procedure: Cryotherapy Location: Face, Arm, Leg  After discussion of the risks, benefits, and alternative therapies available, the patient elected to proceed. After obtaining written informed consent, the patient's identity, procedure, and site were verified during a time out prior to proceeding procedure. The lesions on the face, arm, and leg were treated using liquid nitrogen spray gun for 6 second per cycle, 3 cycles total. The patient tolerated the procedure well and there were no immediate complications.  Patient was provided aftercare handout and advised to return if lesion(s) did not fully resolved.

## 2022-01-03 ENCOUNTER — Ambulatory Visit (INDEPENDENT_AMBULATORY_CARE_PROVIDER_SITE_OTHER): Payer: Medicare HMO | Admitting: Family Medicine

## 2022-01-03 DIAGNOSIS — E781 Pure hyperglyceridemia: Secondary | ICD-10-CM

## 2022-01-03 DIAGNOSIS — E669 Obesity, unspecified: Secondary | ICD-10-CM

## 2022-01-03 NOTE — Progress Notes (Signed)
Medical Nutrition Therapy Appt start time: 1430 end time: 4970 (1 hour) Primary concerns today: Weight management.   Relevant history/background: Laura Kaufman was referred by Laura Current, DO for MNT related to obesity.  Laura Kaufman was not approved by her insurance, and she cannot afford it; needs to lose weight especially to reduce stress on previously injured knee.  Other diagnoses include HTN and elevated TG.  She identifies herself as a stress eater.  She lives with her fiance, who has poorly controlled DM2, and she is the main Scientist, product/process development.    Assessment: Daily beverage intake includes 32 oz coffee to which she adds 1 packet Swiss Miss hot choc mix (28 g sugar) as well as 2 tbsp stevia, which is the sweetening equivalence of about 1 cup of sugar.    Learning Readiness: Ready; has been trying to limit restaurant and fast food meals; currently gets takeout/restaurant foods 3-4 X wk.  Also using a smaller plate; said she lost 3 lb last week.    Weight: Not checked per pt request.  Ht is 66". Usual eating pattern: 2-3 meals and 1 snack per day. Frequent foods and beverages: 12 oz Pepsi, whole milk, 32 oz coffee with 1 pkt hot choc mix, 2 tbsp stevia); chx, hamburger, fish, fruit, (veg's ~2 wk).  Has hard well water, so rarely drinks water.   Avoided foods: none.   Usual physical activity: none currently.   Sleep: Estimates she gets 8 hrs sleep per night.    24-hr recall: (Up at 9 AM) B (9 AM)-   32 oz coffee with 1 pkt hot choc mix, 2 tbsp stevia Snk ( AM)-   --- L (1 PM)-  2 c baked sausage spaghetti, shredded cheese, side salad, 1 HBd egg, 2 tbsp drsng, 12 oz Pepsi Snk ( PM)-  --- D (6 PM)-  Restaurant: ~4 oz salmon fillet, entree salad, 2 tbsp drsng, 16 oz sweet tea Snk ( PM)-  --- Typical day? Yes.      Nutritional Diagnosis:  NI-5.8.3 Inappropriate intake of types of carbohydrates (specify):   As related to fruits and vegetables as well as beverages.  As evidenced by fewer than 3 servings of  veg's per week and daily intake of sweet beverages.  Handouts given during visit include: After-Visit Summary (AVS) List of vegetables, A to Z  Demonstrated degree of understanding via:  Teach Back  Barriers to learning/adherence to lifestyle change: Longstanding history of poor diet.    Monitoring/Evaluation:  Dietary intake, exercise, and body weight in 4 week(s).

## 2022-01-03 NOTE — Patient Instructions (Signed)
-   Long-term goal is to discontinue Stevia in your coffee, weaning yourself as you can.  (Ok to keep using the cocoa mix in coffee - 1 pkt per day.0 - TASTE PREFERENCES ARE LEARNED.  This means it will get easier to choose foods you know are good for you if you are exposed to them enough.    Main behavioral goals: 1. Make three lists of vegetables, using the vegetables list provided today:    - Those you like and eat now   - Vegetables you won't even consider   - Vegetables you might consider trying if they are prepared a certain way  Continue to eat vegetables you currently eat, but from list #3, choose a vegetable to try at least 2 times a week.  Use small amounts of this vegetable, cut small, combined with foods or seasonings you like.  Note:   2. Include a serving of vegetables in at least 4 meals per week.       - Fresh or frozen veg's are best.       - Try roasting vegetables, which taste very different from steamed or boiled; you can look up instructions online.       - Add as many vegetables as you can to soups, stews, and casseroles.    3. Spend some time each week finding recipes and designing some meals that include vegetables as well as a source protein and a source of carbohydrate (starch).    Carbohydrate includes starch, sugar, and fiber.  Of these, only sugar and starch raise blood glucose.  (Fiber is found in fruits, vegetables [especially skin, seeds, and stalks], whole grains, and beans.)   Starchy (carb) foods: Bread, rice, pasta, potatoes, corn, cereal, grits, crackers, bagels, muffins, all baked goods.  (Fruit, milk, and yogurt also have carbohydrate, but most of these foods will not spike your blood sugar as most starchy or sweet foods will.)  A few fruits do cause high blood sugars; use small portions of bananas (limit to 1/2 at a time), grapes, watermelon, and oranges.   Protein foods: Meat, fish, poultry, eggs, dairy foods, and beans such as pinto and kidney beans (beans  also provide carbohydrate).   Follow-up appt on Monday, November 13 at 2 PM.  At your follow-up appt, we can talk about stress eating.

## 2022-01-27 NOTE — Progress Notes (Signed)
Medical Nutrition Therapy Appt start time: 1400 end time: 1500 (1 hour) Primary concerns today: Weight management.   Relevant history/background: Laura Kaufman was referred by Darci Current, DO for MNT related to obesity.  Laura Kaufman was not approved by her insurance, and she cannot afford it; needs to lose weight especially to reduce stress on previously injured knee.  Other diagnoses include HTN and elevated TG.  She identifies herself as a stress eater.  She lives with her fiance, who has poorly controlled DM2, and she is the main Scientist, product/process development.    Assessment: Laura Kaufman has been eating a lot of vegetables (in 10 meals last week), and said she feels better and has lost weight.  She has been keeping a daily food record.  She made her list of vegetables she might consider trying, but has not tried new ones yet.  She also has reduced the amount of Stevia she uses in her coffee by half (now using 2 tbsp per 4 c coffee, along with 1 packet Swiss Miss cocoa powder).    Weight: 193.6 lb.  Ht is 66". Usual eating pattern: 2-3 meals and 1 snack per day. Usual physical activity: none currently.   Sleep: Estimates she gets 8 hrs sleep per night.    24-hr recall:  (Up at 8 AM) B (8-10 AM)-  4 c coffee, 1 packet Swiss Miss powder, 2 tbsp stevia Snk ( AM)-  --- L (10 PM)-  2 eggs, 1 toast, 1/2 tsp butter, 1/2 tsp honey,  Snk ( PM)-  12 oz Pepsi, 16 oz sweetened green tea, 3-4 c [employer] buttered popcorn D (5:30 PM)-  1/2 c breaded chx, 1/2 c fried rice, mixed greens, 1 tbsp drsng Snk (10 PM)-  12 oz grape juice Typical day? Yes.     Nutritional Diagnosis: Noted progress on NI-5.8.3 Inappropriate intake of types of carbohydrates (specify):   As related to fruits and vegetables as well as beverages.  As evidenced by least 10 vegetable servings last week.  Handouts given during visit include: After-Visit Summary (AVS) Goals Sheet  Demonstrated degree of understanding via:  Teach Back  Barriers to learning/adherence  to lifestyle change: Longstanding history of vegetable-poor diet.    Monitoring/Evaluation:  Dietary intake, exercise, and body weight in 4 week(s).

## 2022-01-31 ENCOUNTER — Ambulatory Visit (INDEPENDENT_AMBULATORY_CARE_PROVIDER_SITE_OTHER): Payer: Medicare HMO | Admitting: Family Medicine

## 2022-01-31 VITALS — Ht 66.0 in | Wt 193.6 lb

## 2022-01-31 DIAGNOSIS — E669 Obesity, unspecified: Secondary | ICD-10-CM

## 2022-01-31 DIAGNOSIS — E781 Pure hyperglyceridemia: Secondary | ICD-10-CM

## 2022-01-31 NOTE — Patient Instructions (Signed)
Lentils:  You can simmer them in spaghetti sauce for a meat-free sauce that provides protein.  You might also like to experiment with lentil soup.    Increasing vegetables: - Try some vegetables roasted, such as Brussels sprouts, zucchini or yellow squash, or asparagus.  (You can even use frozen for roasting.) - Also consider adding any vegetables such as snow peas, broccoli or cauliflower, or any type of bean, to soup - even if you have never had those vegetables in that soup before.   - Add veg's to sauces such as spaghetti sauce.    GOALS:  1. Walk the driveway (or ride the stationary bike at 15 least minutes) at least 3 times per week.   2. Include a serving of vegetables in at least  meals per week.   Include a new vegetable at least 2 times a week.  Use small amounts of this vegetable, cut small, combined with foods or seasonings you like. 3. Spend some time each week finding recipes and designing some meals that include vegetables as well as a source protein and a source of carbohydrate (starch).   Follow-up appt on Monday, Dec 11 at 1:30 PM.

## 2022-02-22 ENCOUNTER — Encounter: Payer: Self-pay | Admitting: Student

## 2022-02-24 NOTE — Progress Notes (Deleted)
Medical Nutrition Therapy Appt start time: 1400 end time: 1500 (1 hour) Primary concerns today: Weight management.   Relevant history/background: Ajanay was referred by Darci Current, DO for MNT related to obesity.  Mancel Parsons was not approved by her insurance, and she cannot afford it; needs to lose weight especially to reduce stress on previously injured knee.  Other diagnoses include HTN and elevated TG.  She identifies herself as a stress eater.  She lives with her fiance, who has poorly controlled DM2, and she is the main Scientist, product/process development.    Assessment: Reneshia has ***continued keeping a daily food record.  New vegetables she has tried include ***.  Now using ***2 tbsp Stevia and 1 packet Swiss Miss cocoa powder per 4 c coffee.    Weight: ***.  (193.6 lb on 01/31/22; Ht is 66".) Usual eating pattern: 2-3 meals and 1 snack per day. Usual physical activity: none currently.   Sleep: Estimates she gets 8 hrs sleep per night.    24-hr recall suggests intake of *** kcal:  (Up at  AM) B ( AM)-   Snk ( AM)-   L ( PM)-   Snk ( PM)-   D ( PM)-   Snk ( PM)-   Typical day? {yes [id number]    Nutritional Diagnosis: *** progress on NI-5.8.3 Inappropriate intake of types of carbohydrates (specify):   As related to fruits and vegetables as well as beverages.  As evidenced by least 10 vegetable servings last week. ***  Handouts given during visit include: After-Visit Summary (AVS) Goals Sheet  Demonstrated degree of understanding via:  Teach Back  Barriers to learning/adherence to lifestyle change: Longstanding history of vegetable-poor diet.    Monitoring/Evaluation:  Dietary intake, exercise, and body weight in 4 week(s).

## 2022-02-28 ENCOUNTER — Ambulatory Visit: Payer: Medicare HMO | Admitting: Family Medicine

## 2022-08-01 ENCOUNTER — Ambulatory Visit (INDEPENDENT_AMBULATORY_CARE_PROVIDER_SITE_OTHER): Payer: Medicare HMO | Admitting: Student

## 2022-08-01 ENCOUNTER — Encounter: Payer: Self-pay | Admitting: Student

## 2022-08-01 VITALS — BP 126/78 | HR 98 | Wt 193.0 lb

## 2022-08-01 DIAGNOSIS — Z13220 Encounter for screening for lipoid disorders: Secondary | ICD-10-CM | POA: Diagnosis not present

## 2022-08-01 DIAGNOSIS — Z713 Dietary counseling and surveillance: Secondary | ICD-10-CM

## 2022-08-01 DIAGNOSIS — I10 Essential (primary) hypertension: Secondary | ICD-10-CM | POA: Diagnosis not present

## 2022-08-01 DIAGNOSIS — E781 Pure hyperglyceridemia: Secondary | ICD-10-CM | POA: Diagnosis not present

## 2022-08-01 MED ORDER — LOSARTAN POTASSIUM 25 MG PO TABS: 25.0000 mg | ORAL_TABLET | Freq: Every day | ORAL | 3 refills | Status: DC

## 2022-08-01 NOTE — Progress Notes (Signed)
    SUBJECTIVE:   CHIEF COMPLAINT / HPI:   Laura Kaufman is a 67 y.o. female  presenting for blood pressure follow up. She reports trying to donate blood and being unable to do so due to having elevated pressures to 154/104. She reports not taking her Losartan daily, just PRN. She has been on this medication for many years and has seemed to tolerate it well in the past.  She denies checking her blood pressures at home.  She reports stress around caring for her partner.   Weight loss: she was referred to nutrition clinic and reports eating more fruits and vegetables after that visit. She is interested in continued weight loss for her knee OA and overall health. Attempted to put her on GLP1 in the past but insurance did not approve. She is open to referral to [facility].   PERTINENT  PMH / PSH: reviewed and updated   OBJECTIVE:   BP 126/78   Pulse 98   Wt 193 lb (87.5 kg)   SpO2 96%   BMI 31.15 kg/m   Well-appearing, no acute distress Cardio: Regular rate, regular rhythm, no murmurs on exam. Pulm: Clear, no wheezing, no crackles. No increased work of breathing Abdominal: bowel sounds present, soft, non-tender, non-distended Extremities: no peripheral edema    ASSESSMENT/PLAN:   HTN (hypertension) Blood pressure initially elevated to 138/88 in the office.  On recheck was 126/78.  With patient taking medication as needed and not checking blood pressures at home and is difficult to determine whether or not she needs this medication.  Will need to follow pressures closely. - Check BMP today for kidney and electrolytes - Continue losartan 25 mg daily and assess for hypotension at next visit - Follow-up in 2 to 4 weeks for blood pressure recheck  Encounter for weight loss counseling Discussed sending patient to [facility] for continue evaluation for weight loss.  Sent in referral and also gave patient office information to call and schedule an  appointment.  Hypertriglyceridemia without hypercholesterolemia Recheck lipid panel today     Glendale Chard, DO [facility]

## 2022-08-01 NOTE — Patient Instructions (Addendum)
Your blood pressure is high due to not taking your medication every day. Start taking your losartan daily.   I am checking labs for your kidneys and electrolytes. I will call you with results.   I sent in a referral to [facility]: [address] [place], [place], [place] [address]  You can also call and schedule your own appointment.  Phone: [phone]

## 2022-08-01 NOTE — Assessment & Plan Note (Signed)
Discussed sending patient to [facility] for continue evaluation for weight loss.  Sent in referral and also gave patient office information to call and schedule an appointment.

## 2022-08-01 NOTE — Assessment & Plan Note (Signed)
Recheck lipid panel today. 

## 2022-08-01 NOTE — Assessment & Plan Note (Addendum)
Blood pressure initially elevated to 138/88 in the office.  On recheck was 126/78.  With patient taking medication as needed and not checking blood pressures at home and is difficult to determine whether or not she needs this medication.  Will need to follow pressures closely. - Check BMP today for kidney and electrolytes - Continue losartan 25 mg daily and assess for hypotension at next visit - Follow-up in 2 to 4 weeks for blood pressure recheck

## 2022-08-02 LAB — LIPID PANEL
Chol/HDL Ratio: 3 ratio (ref 0.0–4.4)
Cholesterol, Total: 130 mg/dL (ref 100–199)
HDL: 44 mg/dL (ref >39)
LDL Chol Calc (NIH): 67 mg/dL (ref 0–99)
Triglycerides: 102 mg/dL (ref 0–149)
VLDL Cholesterol Cal: 19 mg/dL (ref 5–40)

## 2022-08-02 LAB — BASIC METABOLIC PANEL
BUN/Creatinine Ratio: 12 (ref 12–28)
BUN: 9 mg/dL (ref 8–27)
CO2: 22 mmol/L (ref 20–29)
Calcium: 10.4 mg/dL — ABNORMAL HIGH (ref 8.7–10.3)
Chloride: 105 mmol/L (ref 96–106)
Creatinine, Ser: 0.78 mg/dL (ref 0.57–1.00)
Glucose: 99 mg/dL (ref 70–99)
Potassium: 4.4 mmol/L (ref 3.5–5.2)
Sodium: 142 mmol/L (ref 134–144)
eGFR: 83 mL/min/{1.73_m2} (ref >59)

## 2022-08-22 ENCOUNTER — Ambulatory Visit (INDEPENDENT_AMBULATORY_CARE_PROVIDER_SITE_OTHER): Payer: Medicare HMO

## 2022-08-22 VITALS — Ht 66.0 in | Wt 193.0 lb

## 2022-08-22 DIAGNOSIS — Z0000 Encounter for general adult medical examination without abnormal findings: Secondary | ICD-10-CM

## 2022-08-22 NOTE — Patient Instructions (Addendum)
[name] , Thank you for taking time to come for your Medicare Wellness Visit. I appreciate your ongoing commitment to your health goals. Please review the following plan we discussed and let me know if I can assist you in the future.   These are the goals we discussed:  Goals      Remain active and independent        This is a list of the screening recommended for you and due dates:  Health Maintenance  Topic Date Due   Pneumonia Vaccine (2 of 2 - PPSV23 or PCV20) 04/23/2020   COVID-19 Vaccine (5 - 2023-24 season) 11/19/2021   Colon Cancer Screening  10/07/2022   Flu Shot  10/20/2022   Medicare Annual Wellness Visit  08/22/2023   Mammogram  09/01/2023   DTaP/Tdap/Td vaccine (2 - Td or Tdap) 03/02/2025   DEXA scan (bone density measurement)  Completed   Hepatitis C Screening  Completed   Zoster (Shingles) Vaccine  Completed   HPV Vaccine  Aged Out    Advanced directives: Information on Advanced Care Planning can be found at [facility] Advance Health Care Directives Advance Health Care Directives ([email or web address]  Please bring a copy of your health care power of attorney and living will to the office to be added to your chart at your convenience.   Conditions/risks identified: Aim for 30 minutes of exercise or brisk walking, 6-8 glasses of water, and 5 servings of fruits and vegetables each day.  Next appointment: Follow up in one year for your annual wellness visit.   Preventive Care 50 Years and Older, Female  Preventive care refers to lifestyle choices and visits with your health care provider that can promote health and wellness. What does preventive care include? A yearly physical exam. This is also called an annual well check. Dental exams once or twice a year. Routine eye exams. Ask your health care provider how often you should have your eyes checked. Personal lifestyle choices, including: Daily care of your teeth and gums. Regular physical  activity. Eating a healthy diet. Avoiding tobacco and drug use. Limiting alcohol use. Practicing safe sex. Taking low doses of aspirin every day. Taking vitamin and mineral supplements as recommended by your health care provider. What happens during an annual well check? The services and screenings done by your health care provider during your annual well check will depend on your age, overall health, lifestyle risk factors, and family history of disease. Counseling  Your health care provider may ask you questions about your: Alcohol use. Tobacco use. Drug use. Emotional well-being. Home and relationship well-being. Sexual activity. Eating habits. History of falls. Memory and ability to understand (cognition). Work and work Astronomer. Screening  You may have the following tests or measurements: Height, weight, and BMI. Blood pressure. Lipid and cholesterol levels. These may be checked every 5 years, or more frequently if you are over 43 years old. Skin check. Lung cancer screening. You may have this screening every year starting at age 70 if you have a 30-pack-year history of smoking and currently smoke or have quit within the past 15 years. Fecal occult blood test (FOBT) of the stool. You may have this test every year starting at age 37. Flexible sigmoidoscopy or colonoscopy. You may have a sigmoidoscopy every 5 years or a colonoscopy every 10 years starting at age 29. Prostate cancer screening. Recommendations will vary depending on your family history and other risks. Hepatitis C blood test. Hepatitis B blood  test. Sexually transmitted disease (STD) testing. Diabetes screening. This is done by checking your blood sugar (glucose) after you have not eaten for a while (fasting). You may have this done every 1-3 years. Abdominal aortic aneurysm (AAA) screening. You may need this if you are a current or former smoker. Osteoporosis. You may be screened starting at age 68 if you are  at high risk. Talk with your health care provider about your test results, treatment options, and if necessary, the need for more tests. Vaccines  Your health care provider may recommend certain vaccines, such as: Influenza vaccine. This is recommended every year. Tetanus, diphtheria, and acellular pertussis (Tdap, Td) vaccine. You may need a Td booster every 10 years. Zoster vaccine. You may need this after age 28. Pneumococcal 13-valent conjugate (PCV13) vaccine. One dose is recommended after age 76. Pneumococcal polysaccharide (PPSV23) vaccine. One dose is recommended after age 52. Talk to your health care provider about which screenings and vaccines you need and how often you need them. This information is not intended to replace advice given to you by your health care provider. Make sure you discuss any questions you have with your health care provider. Document Released: 04/03/2015 Document Revised: 11/25/2015 Document Reviewed: 01/06/2015 Elsevier Interactive Patient Education  2017 [employer].  Fall Prevention in the Home Falls can cause injuries. They can happen to people of all ages. There are many things you can do to make your home safe and to help prevent falls. What can I do on the outside of my home? Regularly fix the edges of walkways and driveways and fix any cracks. Remove anything that might make you trip as you walk through a door, such as a raised step or threshold. Trim any bushes or trees on the path to your home. Use bright outdoor lighting. Clear any walking paths of anything that might make someone trip, such as rocks or tools. Regularly check to see if handrails are loose or broken. Make sure that both sides of any steps have handrails. Any raised decks and porches should have guardrails on the edges. Have any leaves, snow, or ice cleared regularly. Use sand or salt on walking paths during winter. Clean up any spills in your garage right away. This includes oil  or grease spills. What can I do in the bathroom? Use night lights. Install grab bars by the toilet and in the tub and shower. Do not use towel bars as grab bars. Use non-skid mats or decals in the tub or shower. If you need to sit down in the shower, use a plastic, non-slip stool. Keep the floor dry. Clean up any water that spills on the floor as soon as it happens. Remove soap buildup in the tub or shower regularly. Attach bath mats securely with double-sided non-slip rug tape. Do not have throw rugs and other things on the floor that can make you trip. What can I do in the bedroom? Use night lights. Make sure that you have a light by your bed that is easy to reach. Do not use any sheets or blankets that are too big for your bed. They should not hang down onto the floor. Have a firm chair that has side arms. You can use this for support while you get dressed. Do not have throw rugs and other things on the floor that can make you trip. What can I do in the kitchen? Clean up any spills right away. Avoid walking on wet floors. Keep items that  you use a lot in easy-to-reach places. If you need to reach something above you, use a strong step stool that has a grab bar. Keep electrical cords out of the way. Do not use floor polish or wax that makes floors slippery. If you must use wax, use non-skid floor wax. Do not have throw rugs and other things on the floor that can make you trip. What can I do with my stairs? Do not leave any items on the stairs. Make sure that there are handrails on both sides of the stairs and use them. Fix handrails that are broken or loose. Make sure that handrails are as long as the stairways. Check any carpeting to make sure that it is firmly attached to the stairs. Fix any carpet that is loose or worn. Avoid having throw rugs at the top or bottom of the stairs. If you do have throw rugs, attach them to the floor with carpet tape. Make sure that you have a light  switch at the top of the stairs and the bottom of the stairs. If you do not have them, ask someone to add them for you. What else can I do to help prevent falls? Wear shoes that: Do not have high heels. Have rubber bottoms. Are comfortable and fit you well. Are closed at the toe. Do not wear sandals. If you use a stepladder: Make sure that it is fully opened. Do not climb a closed stepladder. Make sure that both sides of the stepladder are locked into place. Ask someone to hold it for you, if possible. Clearly mark and make sure that you can see: Any grab bars or handrails. First and last steps. Where the edge of each step is. Use tools that help you move around (mobility aids) if they are needed. These include: Canes. Walkers. Scooters. Crutches. Turn on the lights when you go into a dark area. Replace any light bulbs as soon as they burn out. Set up your furniture so you have a clear path. Avoid moving your furniture around. If any of your floors are uneven, fix them. If there are any pets around you, be aware of where they are. Review your medicines with your doctor. Some medicines can make you feel dizzy. This can increase your chance of falling. Ask your doctor what other things that you can do to help prevent falls. This information is not intended to replace advice given to you by your health care provider. Make sure you discuss any questions you have with your health care provider. Document Released: 01/01/2009 Document Revised: 08/13/2015 Document Reviewed: 04/11/2014 Elsevier Interactive Patient Education  2017 [employer].

## 2022-08-22 NOTE — Progress Notes (Signed)
Subjective:   Laura Kaufman is a 67 y.o. female who presents for an Initial Medicare Annual Wellness Visit.  I connected with  Laura Kaufman on 08/22/22 by a audio enabled telemedicine application and verified that I am speaking with the correct person using two identifiers.  Patient Location: Home  Provider Location: Home Office  I discussed the limitations of evaluation and management by telemedicine. The patient expressed understanding and agreed to proceed.  Review of Systems     Cardiac Risk Factors include: advanced age (>94men, >69 women);hypertension     Objective:    Today's Vitals   08/22/22 1157  Weight: 193 lb (87.5 kg)  Height: 5\' 6"  (1.676 m)   Body mass index is 31.15 kg/m.     08/22/2022   12:02 PM 08/01/2022   11:22 AM 12/23/2021    1:48 PM 12/22/2021    9:59 AM 11/05/2021    3:09 PM 04/14/2021    8:41 AM 07/14/2020    2:06 PM  Advanced Directives  Does Patient Have a Medical Advance Directive? No No No No No No No  Would patient like information on creating a medical advance directive? Yes (MAU/Ambulatory/Procedural Areas - Information given) No - Patient declined No - Patient declined No - Patient declined No - Patient declined No - Patient declined No - Patient declined    Current Medications (verified) Outpatient Encounter Medications as of 08/22/2022  Medication Sig   losartan (COZAAR) 25 MG tablet Take 1 tablet (25 mg total) by mouth daily.   No facility-administered encounter medications on file as of 08/22/2022.    Allergies (verified) Bee venom and Penicillins   History: Past Medical History:  Diagnosis Date   Allergy Surgery on elbow??? 15 years ago   Penicillin   Arthritis 15 years ago   On turmeric 2x500 mg   Bipolar I disorder, most recent episode (or current) manic (HCC) 07/09/2016   Cataract    Surgery about 5 years ago   Family history of skin cancer    GERD (gastroesophageal reflux disease)    History of colonic polyps     Hypertension    25 mg high blood pressure drug   Past Surgical History:  Procedure Laterality Date   ABDOMINAL HYSTERECTOMY     total   CATARACT EXTRACTION, BILATERAL     COLONOSCOPY  04/17/2019   ELBOW FRACTURE SURGERY Bilateral    FRACTURE SURGERY     Both elbows   JOINT REPLACEMENT     Broken Knee cap   KNEE SURGERY     WRIST SURGERY     no surgery per pt, but "popped bones back into place"   Family History  Problem Relation Age of Onset   Colon cancer Mother 27   Skin cancer Father        dx 89s (multiple)   Cancer Father    Skin cancer Sister        dx 30s   Cystic fibrosis Brother    Arthritis Paternal Grandmother    Cancer Sister    Esophageal cancer Neg Hx    Stomach cancer Neg Hx    Rectal cancer Neg Hx    Colon polyps Neg Hx    Social History   Socioeconomic History   Marital status: Significant Other    Spouse name: Not on file   Number of children: Not on file   Years of education: Not on file   Highest education level: Not on file  Occupational History  Not on file  Tobacco Use   Smoking status: Former    Passive exposure: Past   Smokeless tobacco: Never   Tobacco comments:    quit 20 years ago  Vaping Use   Vaping Use: Never used  Substance and Sexual Activity   Alcohol use: Not Currently   Drug use: Never   Sexual activity: Not Currently    Partners: Male    Birth control/protection: Post-menopausal  Other Topics Concern   Not on file  Social History Narrative   Not on file   Social Determinants of Health   Financial Resource Strain: Low Risk  (08/22/2022)   Overall Financial Resource Strain (CARDIA)    Difficulty of Paying Living Expenses: Not hard at all  Food Insecurity: No Food Insecurity (08/22/2022)   Hunger Vital Sign    Worried About Running Out of Food in the Last Year: Never true    Ran Out of Food in the Last Year: Never true  Transportation Needs: No Transportation Needs (08/22/2022)   PRAPARE - Scientist, research (physical sciences) (Medical): No    Lack of Transportation (Non-Medical): No  Physical Activity: Inactive (08/22/2022)   Exercise Vital Sign    Days of Exercise per Week: 0 days    Minutes of Exercise per Session: 0 min  Stress: No Stress Concern Present (08/22/2022)   [employer] of Occupational Health - Occupational Stress Questionnaire    Feeling of Stress : Only a little  Social Connections: Moderately Integrated (08/22/2022)   Social Connection and Isolation Panel [NHANES]    Frequency of Communication with Friends and Family: Twice a week    Frequency of Social Gatherings with Friends and Family: Once a week    Attends Religious Services: 1 to 4 times per year    Active Member of [employer] or Organizations: No    Attends Engineer, structural: Never    Marital Status: Living with partner    Tobacco Counseling Counseling given: Not Answered Tobacco comments: quit 20 years ago   Clinical Intake:  Pre-visit preparation completed: Yes  Pain : No/denies pain  Diabetes: No  How often do you need to have someone help you when you read instructions, pamphlets, or other written materials from your doctor or pharmacy?: 1 - Never  Diabetic?No   Interpreter Needed?: No  Information entered by :: Kandis Fantasia LPN   Activities of Daily Living    08/22/2022   12:01 PM 08/18/2022   10:53 AM  In your present state of health, do you have any difficulty performing the following activities:  Hearing? 0 0  Vision? 0 0  Difficulty concentrating or making decisions? 0 0  Walking or climbing stairs? 1 1  Dressing or bathing? 0 0  Doing errands, shopping? 0 0  Preparing Food and eating ? N N  Using the Toilet? N N  In the past six months, have you accidently leaked urine? N N  Do you have problems with loss of bowel control? N N  Managing your Medications? N N  Managing your Finances? N N  Housekeeping or managing your Housekeeping? N N    Patient Care Team: Glendale Chard,  DO as PCP - General (Family Medicine) Linna Darner, RD as Dietitian (Family Medicine)  Indicate any recent Medical Services you may have received from other than [facility] providers in the past year (date may be approximate).     Assessment:   This is a routine wellness examination for Laura Kaufman.  Hearing/Vision screen Hearing Screening - Comments:: Denies hearing difficulties   Vision Screening - Comments:: up to date with routine eye exams    Dietary issues and exercise activities discussed: Current Exercise Habits: The patient does not participate in regular exercise at present   Goals Addressed             This Visit's Progress    Remain active and independent        Depression Screen    08/22/2022   11:59 AM 08/01/2022   11:25 AM 12/22/2021    9:59 AM 11/05/2021    3:10 PM 04/14/2021    8:42 AM 07/14/2020    2:07 PM 06/29/2020    2:09 PM  PHQ 2/9 Scores  PHQ - 2 Score 0 0 0 0 3 0 2  PHQ- 9 Score 6 6 1 3 12 1 7     Fall Risk    08/22/2022   12:00 PM 08/18/2022   10:53 AM 08/01/2022   11:25 AM 12/22/2021    9:59 AM 11/05/2021    3:10 PM  Fall Risk   Falls in the past year? 1 1 0 0 0  Number falls in past yr: 0 0 0 0 0  Injury with Fall? 0 0 0 0 0  Risk for fall due to : History of fall(s)  No Fall Risks    Follow up Education provided;Falls prevention discussed;Falls evaluation completed  Falls prevention discussed      FALL RISK PREVENTION PERTAINING TO THE HOME:  Any stairs in or around the home? Yes  If so, are there any without handrails? No  Home free of loose throw rugs in walkways, pet beds, electrical cords, etc? Yes  Adequate lighting in your home to reduce risk of falls? Yes   ASSISTIVE DEVICES UTILIZED TO PREVENT FALLS:  Life alert? No  Use of a cane, walker or w/c? No  Grab bars in the bathroom? Yes  Shower chair or bench in shower? No  Elevated toilet seat or a handicapped toilet? Yes   TIMED UP AND GO:  Was the test performed? No . Telephonic   visit   Cognitive Function:        08/22/2022   12:02 PM  6CIT Screen  What Year? 0 points  What month? 0 points  What time? 0 points  Count back from 20 0 points  Months in reverse 0 points  Repeat phrase 0 points  Total Score 0 points    Immunizations Immunization History  Administered Date(s) Administered   Fluad Quad(high Dose 65+) 01/31/2020   Influenza,inj,Quad PF,6+ Mos 04/24/2019   Influenza-Unspecified 02/22/2022   PFIZER(Purple Top)SARS-COV-2 Vaccination 05/12/2019, 06/05/2019, 01/18/2020   Pfizer Covid-19 Vaccine Bivalent Booster 55yrs & up 01/21/2021   Pneumococcal Conjugate-13 04/24/2019   Tdap 03/03/2015   Zoster Recombinat (Shingrix) 11/10/2020, 01/11/2021    TDAP status: Up to date  Pneumococcal vaccine status: Due, Education has been provided regarding the importance of this vaccine. Advised may receive this vaccine at local pharmacy or Health Dept. Aware to provide a copy of the vaccination record if obtained from local pharmacy or Health Dept. Verbalized acceptance and understanding.  Covid-19 vaccine status: Information provided on how to obtain vaccines.   Qualifies for Shingles Vaccine? Yes   Zostavax completed No   Shingrix Completed?: Yes  Screening Tests Health Maintenance  Topic Date Due   Pneumonia Vaccine 65+ Years old (2 of 2 - PPSV23 or PCV20) 04/23/2020   COVID-19 Vaccine (5 - 2023-24  season) 11/19/2021   Colonoscopy  10/07/2022   INFLUENZA VACCINE  10/20/2022   Medicare Annual Wellness (AWV)  08/22/2023   MAMMOGRAM  09/01/2023   DTaP/Tdap/Td (2 - Td or Tdap) 03/02/2025   DEXA SCAN  Completed   Hepatitis C Screening  Completed   Zoster Vaccines- Shingrix  Completed   HPV VACCINES  Aged Out    Health Maintenance  Health Maintenance Due  Topic Date Due   Pneumonia Vaccine 1+ Years old (2 of 2 - PPSV23 or PCV20) 04/23/2020   COVID-19 Vaccine (5 - 2023-24 season) 11/19/2021   Colonoscopy  10/07/2022    Colorectal cancer  screening: Type of screening: Colonoscopy. Completed 10/06/20. Repeat every 2 years  Mammogram status: Completed 08/31/21. Repeat every year  Bone Density status: Completed 07/15/19. Results reflect: Bone density results: OSTEOPOROSIS. Repeat every 2 years.  Lung Cancer Screening: (Low Dose CT Chest recommended if Age 82-80 years, 30 pack-year currently smoking OR have quit w/in 15years.) does not qualify.   Lung Cancer Screening Referral: n/a  Additional Screening:  Hepatitis C Screening: does qualify; Completed 03/03/15  Vision Screening: Recommended annual ophthalmology exams for early detection of glaucoma and other disorders of the eye. Is the patient up to date with their annual eye exam?  Yes  Who is the provider or what is the name of the office in which the patient attends annual eye exams? Unable to provide name  If pt is not established with a provider, would they like to be referred to a provider to establish care? No .   Dental Screening: Recommended annual dental exams for proper oral hygiene  Community Resource Referral / Chronic Care Management: CRR required this visit?  No   CCM required this visit?  No      Plan:     I have personally reviewed and noted the following in the patient's chart:   Medical and social history Use of alcohol, tobacco or illicit drugs  Current medications and supplements including opioid prescriptions. Patient is not currently taking opioid prescriptions. Functional ability and status Nutritional status Physical activity Advanced directives List of other physicians Hospitalizations, surgeries, and ER visits in previous 12 months Vitals Screenings to include cognitive, depression, and falls Referrals and appointments  In addition, I have reviewed and discussed with patient certain preventive protocols, quality metrics, and best practice recommendations. A written personalized care plan for preventive services as well as general  preventive health recommendations were provided to patient.     Durwin Nora, [place]   03/24/863   Due to this being a virtual visit, the after visit summary with patients personalized plan was offered to patient via mail or my-chart. Patient would like to access on my-chart  Nurse Notes: No concerns

## 2022-08-23 ENCOUNTER — Encounter: Payer: Self-pay | Admitting: Bariatrics

## 2022-08-23 ENCOUNTER — Ambulatory Visit (INDEPENDENT_AMBULATORY_CARE_PROVIDER_SITE_OTHER): Payer: Medicare HMO | Admitting: Bariatrics

## 2022-08-23 VITALS — BP 147/85 | HR 81 | Temp 97.8°F | Ht 65.5 in | Wt 192.0 lb

## 2022-08-23 DIAGNOSIS — I10 Essential (primary) hypertension: Secondary | ICD-10-CM

## 2022-08-23 DIAGNOSIS — G8929 Other chronic pain: Secondary | ICD-10-CM | POA: Diagnosis not present

## 2022-08-23 DIAGNOSIS — Z0289 Encounter for other administrative examinations: Secondary | ICD-10-CM

## 2022-08-23 DIAGNOSIS — E669 Obesity, unspecified: Secondary | ICD-10-CM

## 2022-08-23 DIAGNOSIS — Z6831 Body mass index (BMI) 31.0-31.9, adult: Secondary | ICD-10-CM

## 2022-08-23 DIAGNOSIS — M25562 Pain in left knee: Secondary | ICD-10-CM

## 2022-08-23 NOTE — Progress Notes (Signed)
Office: [phone]  /  Fax: [phone]   Initial Visit  Laura Kaufman was seen in clinic today to evaluate for obesity. She is interested in losing weight to improve overall health and reduce the risk of weight related complications. She presents today to review program treatment options, initial physical assessment, and evaluation.     She was referred by: PCP  When asked what else they would like to accomplish? She states: Adopt healthier eating patterns, Improve energy levels and physical activity, Improve existing medical conditions, and Improve quality of life  When asked how has your weight affected you? She states: Contributed to orthopedic problems or mobility issues, Having fatigue, and Having poor endurance  Some associated conditions: Hypertension and Other: left knee pain  Contributing factors: Stress and Other: Caregiver  Weight promoting medications identified: None  Current nutrition plan: None and Portion control / smart choices  Current level of physical activity: Limited due to chronic pain or orthopedic problems  Current or previous pharmacotherapy: Is interested in pharmacotherapy  Response to medication: Never tried medications   Past medical history includes:   Past Medical History:  Diagnosis Date   Allergy Surgery on elbow??? 15 years ago   Penicillin   Arthritis 15 years ago   On turmeric 2x500 mg   Bipolar I disorder, most recent episode (or current) manic (HCC) 07/09/2016   Cataract    Surgery about 5 years ago   Family history of skin cancer    GERD (gastroesophageal reflux disease)    History of colonic polyps    Hypertension    25 mg high blood pressure drug     Objective:   BP (!) 147/85   Pulse 81   Temp 97.8 F (36.6 C)   Ht 5' 5.5" (1.664 m)   Wt 192 lb (87.1 kg)   SpO2 98%   BMI 31.46 kg/m  She was weighed on the bioimpedance scale: Body mass index is 31.46 kg/m.  Peak Weight:215 lbs , Body Fat%:37.5 % , Visceral Fat  Rating:11, Weight trend over the last 12 months: Decreasing  General:  Alert, oriented and cooperative. Patient is in no acute distress.  Respiratory: Normal respiratory effort, no problems with respiration noted  Extremities: Normal range of motion.    Mental Status: Normal mood and affect. Normal behavior. Normal judgment and thought content.   DIAGNOSTIC DATA REVIEWED:  BMET    Component Value Date/Time   NA 142 08/01/2022 1146   K 4.4 08/01/2022 1146   CL 105 08/01/2022 1146   CO2 22 08/01/2022 1146   GLUCOSE 99 08/01/2022 1146   GLUCOSE 102 (H) 07/16/2020 1137   BUN 9 08/01/2022 1146   CREATININE 0.78 08/01/2022 1146   CREATININE 0.78 07/16/2020 1137   CALCIUM 10.4 (H) 08/01/2022 1146   GFRNONAA >60 07/16/2020 1137   GFRAA 105 04/24/2019 0917   Lab Results  Component Value Date   HGBA1C 5.6 03/03/2015   No results found for: "INSULIN" CBC    Component Value Date/Time   WBC 10.1 02/03/2021 1307   RBC 5.44 (H) 02/03/2021 1307   HGB 15.0 02/03/2021 1307   HGB 16.5 (H) 07/14/2020 1451   HCT 45.5 02/03/2021 1307   HCT 49.0 (H) 07/14/2020 1451   PLT 250 02/03/2021 1307   PLT 260 07/14/2020 1451   MCV 83.6 02/03/2021 1307   MCV 83 07/14/2020 1451   MCH 27.6 02/03/2021 1307   MCHC 33.0 02/03/2021 1307   RDW 12.8 02/03/2021 1307   RDW  13.4 07/14/2020 1451   Iron/TIBC/Ferritin/ %Sat No results found for: "IRON", "TIBC", "FERRITIN", "IRONPCTSAT" Lipid Panel     Component Value Date/Time   CHOL 130 08/01/2022 1146   TRIG 102 08/01/2022 1146   HDL 44 08/01/2022 1146   CHOLHDL 3.0 08/01/2022 1146   LDLCALC 67 08/01/2022 1146   Hepatic Function Panel     Component Value Date/Time   PROT 7.8 07/16/2020 1137   PROT 7.3 04/24/2019 0917   ALBUMIN 4.4 07/16/2020 1137   ALBUMIN 4.4 04/24/2019 0917   AST 40 07/16/2020 1137   ALT 57 (H) 07/16/2020 1137   ALKPHOS 142 (H) 07/16/2020 1137   BILITOT 0.5 07/16/2020 1137      Component Value Date/Time   TSH 2.470  04/24/2019 0917     Assessment and Plan:   Hypertension Hypertension control uncertain.  Medication(s): Losartan 25 mg  BP Readings from Last 3 Encounters:  08/23/22 (!) 147/85  08/01/22 126/78  12/23/21 (!) 144/88   Lab Results  Component Value Date   CREATININE 0.78 08/01/2022   CREATININE 0.78 07/16/2020   CREATININE 0.89 07/06/2020   No results found for: "GFR"  Plan: Continue all antihypertensives at current dosages. No added salt. Will keep sodium content to 1,500 mg or less per day.     Chronic pain of left knee:   She has chronic left knee pain, but states that she can still walk in moderation.  Plan: Will continue to exercise/ walk. No pounding exercises.     Generalized Obesity: Current BMI 31.46    Obesity Treatment / Action Plan:  Patient will work on garnering support from family and friends to begin weight loss journey. Will work on eliminating or reducing the presence of highly palatable, calorie dense foods in the home. Will complete provided nutritional and psychosocial assessment questionnaire before the next appointment. Will be scheduled for indirect calorimetry to determine resting energy expenditure in a fasting state.  This will allow [place] to create a reduced calorie, high-protein meal plan to promote loss of fat mass while preserving muscle mass. Counseled on the health benefits of losing 5%-15% of total body weight. Was counseled on nutritional approaches to weight loss and benefits of reducing processed foods and consuming plant-based foods and high quality protein as part of nutritional weight management. Was counseled on pharmacotherapy and role as an adjunct in weight management.   Obesity Education Performed Today:  She was weighed on the bioimpedance scale and results were discussed and documented in the synopsis.  We discussed obesity as a disease and the importance of a more detailed evaluation of all the factors contributing to the  disease.  We discussed the importance of long term lifestyle changes which include nutrition, exercise and behavioral modifications as well as the importance of customizing this to her specific health and social needs.  We discussed the benefits of reaching a healthier weight to alleviate the symptoms of existing conditions and reduce the risks of the biomechanical, metabolic and psychological effects of obesity.  Discussed New Patient/Late Arrival, and Cancellation Policies. Patient voiced understanding and allowed to ask questions.   Laura Kaufman appears to be in the action stage of change and states they are ready to start intensive lifestyle modifications and behavioral modifications.  30 minutes was spent today on this visit including the above counseling, pre-visit chart review, and post-visit documentation.  Reviewed by clinician on day of visit: allergies, medications, problem list, medical history, surgical history, family history, social history, and previous encounter  notes.    Linzi Ohlinger A. Lorretta HarpO.

## 2022-08-26 ENCOUNTER — Encounter: Payer: Self-pay | Admitting: Family Medicine

## 2022-08-26 ENCOUNTER — Ambulatory Visit (INDEPENDENT_AMBULATORY_CARE_PROVIDER_SITE_OTHER): Payer: Medicare HMO | Admitting: Family Medicine

## 2022-08-26 VITALS — BP 124/72 | HR 92 | Ht 65.5 in | Wt 196.0 lb

## 2022-08-26 DIAGNOSIS — R7401 Elevation of levels of liver transaminase levels: Secondary | ICD-10-CM | POA: Diagnosis not present

## 2022-08-26 DIAGNOSIS — I10 Essential (primary) hypertension: Secondary | ICD-10-CM

## 2022-08-26 NOTE — Progress Notes (Signed)
    SUBJECTIVE:   CHIEF COMPLAINT / HPI:   HTN Ms. Haw is a pleasant 67 yo woman here for BP recheck.   Last seen in clinic 5/13, at which time she reported taking medication intermittently and as needed.  Was recommended to take losartan 25 mg daily and follow-up in 2 weeks.  Was also recommended to bring her home blood pressure cuff for calibration.  Today, patient reports excellent adherence to losartan.  No orthostatic hypotension, dizziness, presyncope, or other side effects.  Reports feeling well.  PERTINENT  PMH / PSH:  Patient Active Problem List   Diagnosis Date Noted   History of colonic polyps 10/23/2020   Family history of skin cancer 10/23/2020   Hypertriglyceridemia without hypercholesterolemia 08/15/2020   Polycythemia 07/14/2020   Encounter for weight loss counseling 07/14/2020   HTN (hypertension) 06/29/2020   Osteoporosis 07/22/2019   Gastroesophageal reflux disease 02/07/2019   Dysphagia 02/07/2019   Family history of hyperthyroidism 03/03/2015   Lumbar compression fracture (HCC) 12/18/2014    OBJECTIVE:   BP 124/72   Pulse 92   Ht 5' 5.5" (1.664 m)   Wt 196 lb (88.9 kg)   SpO2 100%   BMI 32.12 kg/m    PHQ-9:     08/26/2022    1:40 PM 08/22/2022   11:59 AM 08/01/2022   11:25 AM  Depression screen PHQ 2/9  Decreased Interest 1 0 0  Down, Depressed, Hopeless 0 0 0  PHQ - 2 Score 1 0 0  Altered sleeping 1 1 1   Tired, decreased energy 3 2 2   Change in appetite 2 3 3   Feeling bad or failure about yourself  0 0 0  Trouble concentrating 0 0 0  Moving slowly or fidgety/restless 0 0 0  Suicidal thoughts 0 0 0  PHQ-9 Score 7 6 6     Physical Exam General: Awake, alert, oriented Cardiovascular: Regular rate and rhythm, S1 and S2 present, no murmurs auscultated Respiratory: Lung fields clear to auscultation bilaterally  ASSESSMENT/PLAN:   HTN (hypertension) Blood pressure today at goal on losartan 25 mg.  No signs of adverse side effect such as  orthostasis.  Recommend continuing medication without changes.  BMP today to check creatinine and potassium.  Home BP cuff reading approximately 40 points higher both systolic and diastolic.  Recommended she obtain new cuff.    Fayette Pho, MD [facility]

## 2022-08-26 NOTE — Patient Instructions (Addendum)
It was wonderful to see you today. Thank you for allowing me to be a part of your care. Below is a short summary of what we discussed at your visit today:  Hypertension Your blood pressure today looks beautiful!  Keep taking your medication every day.  We drew a little bit of blood today to check in on your kidney function and your potassium.If the results are normal, I will send you a letter or MyChart message. If the results are abnormal, I will give you a call.    Get a new blood pressure cuff. Your current one is WAY off from our measurements.   Health Maintenance We like to think about ways to keep you healthy for years to come. Below are some interventions and screenings we can offer to keep you healthy: -Pneumococcal pneumonia vaccine (Prevnar 20) -COVID booster -Colonoscopy  Please bring all of your medications to every appointment!  If you have any questions or concerns, please do not hesitate to contact us via phone or MyChart message.   Fayette Pho, MD

## 2022-08-27 LAB — COMPREHENSIVE METABOLIC PANEL
ALT: 45 IU/L — ABNORMAL HIGH (ref 0–32)
AST: 33 IU/L (ref 0–40)
Albumin/Globulin Ratio: 1.7 (ref 1.2–2.2)
Albumin: 4.7 g/dL (ref 3.9–4.9)
Alkaline Phosphatase: 162 IU/L — ABNORMAL HIGH (ref 44–121)
BUN/Creatinine Ratio: 13 (ref 12–28)
BUN: 12 mg/dL (ref 8–27)
Bilirubin Total: 0.6 mg/dL (ref 0.0–1.2)
CO2: 23 mmol/L (ref 20–29)
Calcium: 10.8 mg/dL — ABNORMAL HIGH (ref 8.7–10.3)
Chloride: 102 mmol/L (ref 96–106)
Creatinine, Ser: 0.89 mg/dL (ref 0.57–1.00)
Globulin, Total: 2.8 g/dL (ref 1.5–4.5)
Glucose: 151 mg/dL — ABNORMAL HIGH (ref 70–99)
Potassium: 4.1 mmol/L (ref 3.5–5.2)
Sodium: 142 mmol/L (ref 134–144)
Total Protein: 7.5 g/dL (ref 6.0–8.5)
eGFR: 71 mL/min/{1.73_m2} (ref >59)

## 2022-08-27 NOTE — Assessment & Plan Note (Signed)
Blood pressure today at goal on losartan 25 mg.  No signs of adverse side effect such as orthostasis.  Recommend continuing medication without changes.  BMP today to check creatinine and potassium.  Home BP cuff reading approximately 40 points higher both systolic and diastolic.  Recommended she obtain new cuff.

## 2022-08-29 ENCOUNTER — Telehealth: Payer: Self-pay | Admitting: Family Medicine

## 2022-08-29 ENCOUNTER — Encounter: Payer: Self-pay | Admitting: Family Medicine

## 2022-08-29 ENCOUNTER — Encounter: Payer: Self-pay | Admitting: Student

## 2022-08-29 DIAGNOSIS — R7401 Elevation of levels of liver transaminase levels: Secondary | ICD-10-CM

## 2022-08-29 NOTE — Telephone Encounter (Addendum)
Saw elevated calcium with mild transaminitis. Patient does have known osteoporosis, however does not appear to be on a bisphosphonate (chart shows fosamax used May 2021 - April 2022, discontinued "per patient preference").   Calcium normal 07/16/20 and before. Elevated to 10.4 on 08/01/22 and 10.8 on 08/26/22.  AST normal on previous checks 2021, 2022, and 2024.   ALT minimally elevated on all 3 previous checks:    Alk phos only measured twice previously. Appears to be mildly elevated and insidiously worsening since 2021.    Called patient to discuss risk factors:  Chronic viral hepatitis - no history of hepatitis infections, no previous or current injection drug use ETOH - very rare (one drink this calendar year) NAFLD - no known diagnosis Autoimmune hepatitis - no known autoimmune disorders Alpha-1 antitrypsin deficiency - personal or family history Brother did die from cystic fibrosis Hemochromatosis - no personal or family history Thyroid disorders - no known diagnosis, previous TSH normal, mother with history of hyperthyroidism Celiac - no known diagnosis Anorexa nervosa - no known diagnosis, current or previous Malignant infiltration - had total hysterectomy in 30s due to tumor (but not cancerous)  Given the lack of risk factors, vitamin D deficiency and osteoporosis is the most likely explanation. Will add on Vitamin D and GGT to blood work. Will follow.  Fayette Pho, MD

## 2022-08-30 ENCOUNTER — Encounter: Payer: Self-pay | Admitting: Family Medicine

## 2022-08-31 LAB — VITAMIN D 25 HYDROXY (VIT D DEFICIENCY, FRACTURES): Vit D, 25-Hydroxy: 12.7 ng/mL — ABNORMAL LOW (ref 30.0–100.0)

## 2022-08-31 LAB — GAMMA GT: GGT: 26 IU/L (ref 0–60)

## 2022-08-31 LAB — SPECIMEN STATUS REPORT

## 2022-09-02 ENCOUNTER — Telehealth: Payer: Self-pay | Admitting: Family Medicine

## 2022-09-02 DIAGNOSIS — R748 Abnormal levels of other serum enzymes: Secondary | ICD-10-CM

## 2022-09-02 DIAGNOSIS — M818 Other osteoporosis without current pathological fracture: Secondary | ICD-10-CM

## 2022-09-02 MED ORDER — VITAMIN D (CHOLECALCIFEROL) 25 MCG (1000 UT) PO TABS
1000.0000 [IU]/d | ORAL_TABLET | Freq: Every day | ORAL | 2 refills | Status: DC
Start: 2022-09-02 — End: 2022-10-05

## 2022-09-02 NOTE — Telephone Encounter (Signed)
Called patient regarding results. Work up of hypercalcemia and elevated alk phos. GGT low, indicating bone etiology. Vit D low. Known osteoporosis on previous DEXA. Not currently on meds. Previously tried bisphosphonate but stopped due to side effects.   No answer, left VM.   Will start vitamin D with plan to recheck labs in one month.   Encouraged her to meet with PCP to discuss osteoporosis treatment options aside from bisphosphonate.   Fayette Pho, MD

## 2022-09-07 ENCOUNTER — Encounter: Payer: Self-pay | Admitting: Bariatrics

## 2022-09-07 ENCOUNTER — Ambulatory Visit (INDEPENDENT_AMBULATORY_CARE_PROVIDER_SITE_OTHER): Payer: Medicare HMO | Admitting: Bariatrics

## 2022-09-07 VITALS — BP 148/85 | HR 83 | Temp 97.5°F | Ht 65.5 in | Wt 194.0 lb

## 2022-09-07 DIAGNOSIS — R7309 Other abnormal glucose: Secondary | ICD-10-CM | POA: Diagnosis not present

## 2022-09-07 DIAGNOSIS — R5383 Other fatigue: Secondary | ICD-10-CM

## 2022-09-07 DIAGNOSIS — E781 Pure hyperglyceridemia: Secondary | ICD-10-CM

## 2022-09-07 DIAGNOSIS — R0602 Shortness of breath: Secondary | ICD-10-CM | POA: Insufficient documentation

## 2022-09-07 DIAGNOSIS — Z1331 Encounter for screening for depression: Secondary | ICD-10-CM

## 2022-09-07 DIAGNOSIS — E538 Deficiency of other specified B group vitamins: Secondary | ICD-10-CM | POA: Diagnosis not present

## 2022-09-07 DIAGNOSIS — E669 Obesity, unspecified: Secondary | ICD-10-CM

## 2022-09-07 DIAGNOSIS — I10 Essential (primary) hypertension: Secondary | ICD-10-CM | POA: Diagnosis not present

## 2022-09-07 DIAGNOSIS — E559 Vitamin D deficiency, unspecified: Secondary | ICD-10-CM

## 2022-09-07 DIAGNOSIS — Z6831 Body mass index (BMI) 31.0-31.9, adult: Secondary | ICD-10-CM

## 2022-09-07 DIAGNOSIS — Z0000 Encounter for general adult medical examination without abnormal findings: Secondary | ICD-10-CM | POA: Diagnosis not present

## 2022-09-07 MED ORDER — VITAMIN D (ERGOCALCIFEROL) 1.25 MG (50000 UNIT) PO CAPS
50000.0000 [IU] | ORAL_CAPSULE | ORAL | 0 refills | Status: DC
Start: 2022-09-07 — End: 2022-10-31

## 2022-09-08 ENCOUNTER — Encounter (INDEPENDENT_AMBULATORY_CARE_PROVIDER_SITE_OTHER): Payer: Self-pay | Admitting: Bariatrics

## 2022-09-08 DIAGNOSIS — E88819 Insulin resistance, unspecified: Secondary | ICD-10-CM | POA: Insufficient documentation

## 2022-09-08 DIAGNOSIS — R7303 Prediabetes: Secondary | ICD-10-CM | POA: Insufficient documentation

## 2022-09-08 LAB — HEMOGLOBIN A1C
Est. average glucose Bld gHb Est-mCnc: 123 mg/dL
Hgb A1c MFr Bld: 5.9 % — ABNORMAL HIGH (ref 4.8–5.6)

## 2022-09-08 LAB — CBC WITH DIFFERENTIAL/PLATELET
Basophils Absolute: 0.1 10*3/uL (ref 0.0–0.2)
Basos: 1 %
EOS (ABSOLUTE): 0.1 10*3/uL (ref 0.0–0.4)
Eos: 1 %
Hematocrit: 46.8 % — ABNORMAL HIGH (ref 34.0–46.6)
Hemoglobin: 15.1 g/dL (ref 11.1–15.9)
Immature Grans (Abs): 0 10*3/uL (ref 0.0–0.1)
Immature Granulocytes: 0 %
Lymphocytes Absolute: 2.6 10*3/uL (ref 0.7–3.1)
Lymphs: 32 %
MCH: 25.7 pg — ABNORMAL LOW (ref 26.6–33.0)
MCHC: 32.3 g/dL (ref 31.5–35.7)
MCV: 80 fL (ref 79–97)
Monocytes Absolute: 0.5 10*3/uL (ref 0.1–0.9)
Monocytes: 6 %
Neutrophils Absolute: 4.8 10*3/uL (ref 1.4–7.0)
Neutrophils: 60 %
Platelets: 237 10*3/uL (ref 150–450)
RBC: 5.88 x10E6/uL — ABNORMAL HIGH (ref 3.77–5.28)
RDW: 13.2 % (ref 11.7–15.4)
WBC: 8.1 10*3/uL (ref 3.4–10.8)

## 2022-09-08 LAB — TSH+T4F+T3FREE
Free T4: 1.54 ng/dL (ref 0.82–1.77)
T3, Free: 3.4 pg/mL (ref 2.0–4.4)
TSH: 2.2 u[IU]/mL (ref 0.450–4.500)

## 2022-09-08 LAB — LIPID PANEL WITH LDL/HDL RATIO
Cholesterol, Total: 133 mg/dL (ref 100–199)
HDL: 45 mg/dL (ref >39)
LDL Chol Calc (NIH): 71 mg/dL (ref 0–99)
LDL/HDL Ratio: 1.6 ratio (ref 0.0–3.2)
Triglycerides: 88 mg/dL (ref 0–149)
VLDL Cholesterol Cal: 17 mg/dL (ref 5–40)

## 2022-09-08 LAB — VITAMIN B12: Vitamin B-12: 500 pg/mL (ref 232–1245)

## 2022-09-08 LAB — INSULIN, RANDOM: INSULIN: 27.1 u[IU]/mL — ABNORMAL HIGH (ref 2.6–24.9)

## 2022-09-13 NOTE — Progress Notes (Unsigned)
Chief Complaint:   OBESITY Laura Kaufman (MR# [id number]) is a 67 y.o. female who presents for evaluation and treatment of obesity and related comorbidities. Current BMI is Body mass index is 31.79 kg/m. Laura Kaufman has been struggling with her weight for many years and has been unsuccessful in either losing weight, maintaining weight loss, or reaching her healthy weight goal.  Laura Kaufman is currently in the action stage of change and ready to dedicate time achieving and maintaining a healthier weight. Laura Kaufman is interested in becoming our patient and working on intensive lifestyle modifications including (but not limited to) diet and exercise for weight loss.  Patient is here for her initial visit.  I saw her for an information session on 08/23/2022.  Laura Kaufman's habits were reviewed today and are as follows: Her family eats meals together, she thinks her family will eat healthier with her, her desired weight loss is 51 lbs, she started gaining weight after knee surgery, her heaviest weight ever was 215 pounds, she has significant food cravings issues, she snacks frequently in the evenings, she wakes up frequently in the middle of the night to eat, she skips meals frequently, she is frequently drinking liquids with calories, and she struggles with emotional eating.  Depression Screen Laura Kaufman's Food and Mood (modified PHQ-9) score was 10.  Subjective:   1. Other fatigue Laura Kaufman admits to daytime somnolence and denies waking up still tired. Patient has a history of symptoms of daytime fatigue. Laura Kaufman generally gets 6 or 9 hours of sleep per night, and states that she has nightime awakenings and generally restful sleep. Snoring is present. Apneic episodes are not present. Epworth Sleepiness Score is 7.   2. SOB (shortness of breath) on exertion Laura Kaufman notes increasing shortness of breath with exercising and seems to be worsening over time with weight gain. She notes getting out of breath sooner with  activity than she used to. This has not gotten worse recently. Laura Kaufman denies shortness of breath at rest or orthopnea.  3. Primary hypertension Patient's blood pressure is slightly elevated today.  She is taking Cozaar.  4. Hypertriglyceridemia without hypercholesterolemia Patient is not on medications currently.  5. Health care maintenance Given obesity.  6. Vitamin D deficiency Patient is taking prescription vitamin D once weekly.  7. B12 deficiency Patient is not on B12 supplement.  8. Elevated glucose Patient is not on medications.  Assessment/Plan:   1. Other fatigue Laura Kaufman does feel that her weight is causing her energy to be lower than it should be. Fatigue may be related to obesity, depression or many other causes. Labs will be ordered, and in the meanwhile, Cortasia will focus on self care including making healthy food choices, increasing physical activity and focusing on stress reduction.  - EKG 12-Lead - TSH+T4F+T3Free - CBC with Differential/Platelet  2. SOB (shortness of breath) on exertion [name] does feel that she gets out of breath more easily that she used to when she exercises. [name]'s shortness of breath appears to be obesity related and exercise induced. She has agreed to work on weight loss and gradually increase exercise to treat her exercise induced shortness of breath. Will continue to monitor closely.  - TSH+T4F+T3Free - CBC with Differential/Platelet  3. Primary hypertension Patient will continue her medications as directed.  4. Hypertriglyceridemia without hypercholesterolemia We will check labs today, and we will follow-up at patient's next visit.  - Lipid Panel With LDL/HDL Ratio  5. Health care maintenance We will check labs today.  EKG  and IC were reviewed and discussed with the patient today.  - Hemoglobin A1c - Insulin, random - Lipid Panel With LDL/HDL Ratio - TSH+T4F+T3Free - CBC with Differential/Platelet - Vitamin B12  6.  Vitamin D deficiency Patient will continue prescription vitamin D, and we will refill for 1 month.  - Vitamin D, Ergocalciferol, (DRISDOL) 1.25 MG (50000 UNIT) CAPS capsule; Take 1 capsule (50,000 Units total) by mouth every 7 (seven) days.  Dispense: 5 capsule; Refill: 0  7. B12 deficiency We will check labs today, we will follow-up at patient's next visit.  - Vitamin B12  8. Elevated glucose We will check labs today, and we will follow-up at patient's next visit.  - Hemoglobin A1c - Insulin, random  9. Depression screening Laura Kaufman had a positive depression screening. Depression is commonly associated with obesity and often results in emotional eating behaviors. We will monitor this closely and work on CBT to help improve the non-hunger eating patterns. Referral to Psychology may be required if no improvement is seen as she continues in our clinic.  10. Generalized obesity  11. BMI 31.0-31.9,adult Laura Kaufman is currently in the action stage of change and her goal is to continue with weight loss efforts. I recommend [name] begin the structured treatment plan as follows:  She has agreed to the Category 3 Plan.  Meal planning was discussed.  Review labs with the patient from 08/26/2022, CMP, vitamin D, and glucose.  Exercise goals: No exercise has been prescribed at this time.   Behavioral modification strategies: increasing lean protein intake, decreasing simple carbohydrates, increasing vegetables, increasing water intake, decreasing eating out, no skipping meals, meal planning and cooking strategies, keeping healthy foods in the home, and planning for success.  She was informed of the importance of frequent follow-up visits to maximize her success with intensive lifestyle modifications for her multiple health conditions. She was informed we would discuss her lab results at her next visit unless there is a critical issue that needs to be addressed sooner. Laura Kaufman agreed to keep her next visit  at the agreed upon time to discuss these results.  Objective:   Blood pressure (!) 148/85, pulse 83, temperature (!) 97.5 F (36.4 C), height 5' 5.5" (1.664 m), weight 194 lb (88 kg), SpO2 96 %. Body mass index is 31.79 kg/m.  EKG: Normal sinus rhythm, rate 95 BPM.  Indirect Calorimeter completed today shows a VO2 of 279 and a REE of 1930.  Her calculated basal metabolic rate is 4742 thus her basal metabolic rate is better than expected.  General: Cooperative, alert, well developed, in no acute distress. HEENT: Conjunctivae and lids unremarkable. Cardiovascular: Regular rhythm.  Lungs: Normal work of breathing. Neurologic: No focal deficits.   Lab Results  Component Value Date   CREATININE 0.89 08/26/2022   BUN 12 08/26/2022   NA 142 08/26/2022   K 4.1 08/26/2022   CL 102 08/26/2022   CO2 23 08/26/2022   Lab Results  Component Value Date   ALT 45 (H) 08/26/2022   AST 33 08/26/2022   GGT 26 08/26/2022   ALKPHOS 162 (H) 08/26/2022   BILITOT 0.6 08/26/2022   Lab Results  Component Value Date   HGBA1C 5.9 (H) 09/07/2022   HGBA1C 5.6 03/03/2015   Lab Results  Component Value Date   INSULIN 27.1 (H) 09/07/2022   Lab Results  Component Value Date   TSH 2.200 09/07/2022   Lab Results  Component Value Date   CHOL 133 09/07/2022   HDL 45 09/07/2022  LDLCALC 71 09/07/2022   TRIG 88 09/07/2022   CHOLHDL 3.0 08/01/2022   Lab Results  Component Value Date   WBC 8.1 09/07/2022   HGB 15.1 09/07/2022   HCT 46.8 (H) 09/07/2022   MCV 80 09/07/2022   PLT 237 09/07/2022   No results found for: "IRON", "TIBC", "FERRITIN"  Attestation Statements:   Reviewed by clinician on day of visit: allergies, medications, problem list, medical history, surgical history, family history, social history, and previous encounter notes.   Trude Mcburney, am acting as Energy manager for [employer], DO.  I have reviewed the above documentation for accuracy and completeness, and I  agree with the above. Corinna Capra, DO

## 2022-09-15 ENCOUNTER — Telehealth (INDEPENDENT_AMBULATORY_CARE_PROVIDER_SITE_OTHER): Payer: Self-pay | Admitting: Bariatrics

## 2022-09-15 NOTE — Telephone Encounter (Signed)
Spoke to patient and she stated she has already picked up her medication from the pharmacy. No longer needed to change pharmacies at this time.

## 2022-09-15 NOTE — Telephone Encounter (Signed)
Patient called in to say she saw in her my chart that she was prescribed Drisdol 1.25mg , 1 capsule every 7 days and that this RX was sent to [facility]. However they never notified her. Now she would like the script to be sent to [facility] in [place].  Thank you VW

## 2022-09-21 ENCOUNTER — Encounter: Payer: Self-pay | Admitting: Bariatrics

## 2022-09-21 ENCOUNTER — Ambulatory Visit (INDEPENDENT_AMBULATORY_CARE_PROVIDER_SITE_OTHER): Payer: Medicare HMO | Admitting: Bariatrics

## 2022-09-21 VITALS — BP 146/83 | HR 83 | Temp 97.9°F | Ht 65.5 in | Wt 192.0 lb

## 2022-09-21 DIAGNOSIS — E559 Vitamin D deficiency, unspecified: Secondary | ICD-10-CM | POA: Diagnosis not present

## 2022-09-21 DIAGNOSIS — Z6831 Body mass index (BMI) 31.0-31.9, adult: Secondary | ICD-10-CM

## 2022-09-21 DIAGNOSIS — R7303 Prediabetes: Secondary | ICD-10-CM | POA: Diagnosis not present

## 2022-09-21 DIAGNOSIS — E669 Obesity, unspecified: Secondary | ICD-10-CM

## 2022-09-21 MED ORDER — TRULICITY 0.75 MG/0.5ML ~~LOC~~ SOAJ
0.7500 mg | SUBCUTANEOUS | 0 refills | Status: DC
Start: 2022-09-21 — End: 2022-10-20

## 2022-09-21 NOTE — Progress Notes (Unsigned)
Chief Complaint:   OBESITY Laura Kaufman is here to discuss her progress with her obesity treatment plan along with follow-up of her obesity related diagnoses. Laura Kaufman is on the Category 3 Plan and states she is following her eating plan approximately 75% of the time. Laura Kaufman states she is walking in the pool for 15 minutes 2 times per week.  Today's visit was #: 2 Starting weight: 194 lbs Starting date: 09/07/2022 Today's weight: 192 lbs Today's date: 09/21/2022 Total lbs lost to date: 2 Total lbs lost since last in-office visit: 2  Interim History: Patient is down 2 pounds since her last visit.  Subjective:   1. Vitamin D deficiency Patient's recent vitamin D level was 12.7.  2. Prediabetes Patient's recent A1c was 5.9 and insulin 27.1.  Assessment/Plan:   1. Vitamin D deficiency Patient will continue vitamin D as directed.  2. Prediabetes Handouts on insulin resistance and prediabetes were given to the patient.  Patient agreed to start Trulicity 0.75 mg once weekly with no refills.  - Dulaglutide (TRULICITY) 0.75 MG/0.5ML SOPN; Inject 0.75 mg into the skin once a week.  Dispense: 2 mL; Refill: 0  3. Generalized obesity  4. BMI 31.0-31.9,adult Laura Kaufman is currently in the action stage of change. As such, her goal is to continue with weight loss efforts. She has agreed to the Category 3 Plan.   Meal planning was discussed. Reviewed labs with the patient from 09/07/2022, lipid, B12, CBC, A1c, insulin, and thyroid panel. Patient will eat breakfast or have a protein shake. Eating Well handout was given.   Exercise goals: As is.   Behavioral modification strategies: increasing lean protein intake, decreasing simple carbohydrates, increasing vegetables, increasing water intake, decreasing eating out, no skipping meals, meal planning and cooking strategies, keeping healthy foods in the home, and planning for success.  Laura Kaufman has agreed to follow-up with our clinic in 2 weeks. She was  informed of the importance of frequent follow-up visits to maximize her success with intensive lifestyle modifications for her multiple health conditions.   Objective:   Blood pressure (!) 146/83, pulse 83, temperature 97.9 F (36.6 C), height 5' 5.5" (1.664 m), weight 192 lb (87.1 kg), SpO2 96 %. Body mass index is 31.46 kg/m.  General: Cooperative, alert, well developed, in no acute distress. HEENT: Conjunctivae and lids unremarkable. Cardiovascular: Regular rhythm.  Lungs: Normal work of breathing. Neurologic: No focal deficits.   Lab Results  Component Value Date   CREATININE 0.89 08/26/2022   BUN 12 08/26/2022   NA 142 08/26/2022   K 4.1 08/26/2022   CL 102 08/26/2022   CO2 23 08/26/2022   Lab Results  Component Value Date   ALT 45 (H) 08/26/2022   AST 33 08/26/2022   GGT 26 08/26/2022   ALKPHOS 162 (H) 08/26/2022   BILITOT 0.6 08/26/2022   Lab Results  Component Value Date   HGBA1C 5.9 (H) 09/07/2022   HGBA1C 5.6 03/03/2015   Lab Results  Component Value Date   INSULIN 27.1 (H) 09/07/2022   Lab Results  Component Value Date   TSH 2.200 09/07/2022   Lab Results  Component Value Date   CHOL 133 09/07/2022   HDL 45 09/07/2022   LDLCALC 71 09/07/2022   TRIG 88 09/07/2022   CHOLHDL 3.0 08/01/2022   Lab Results  Component Value Date   VD25OH 12.7 (L) 08/26/2022   Lab Results  Component Value Date   WBC 8.1 09/07/2022   HGB 15.1 09/07/2022   HCT  46.8 (H) 09/07/2022   MCV 80 09/07/2022   PLT 237 09/07/2022   No results found for: "IRON", "TIBC", "FERRITIN"  Attestation Statements:   Reviewed by clinician on day of visit: allergies, medications, problem list, medical history, surgical history, family history, social history, and previous encounter notes.   Trude Mcburney, am acting as Energy manager for [employer], DO.  I have reviewed the above documentation for accuracy and completeness, and I agree with the above. Corinna Capra, DO

## 2022-09-21 NOTE — Telephone Encounter (Signed)
Spoke with patient in regards to Vitamin D.   Healthy weight sent her in the once a week dose and previous PCP sent in daily dose.   She reports she has been taking the daily dose Vitamin D.   She reports she is seeing health weight today and will decide if she will transition to weekly dose.   Patient will let us know via mychart.

## 2022-09-26 ENCOUNTER — Telehealth: Payer: Self-pay

## 2022-09-26 NOTE — Telephone Encounter (Signed)
Prior authorization for Trulicity started on covermymeds.

## 2022-09-26 NOTE — Telephone Encounter (Signed)
Trulicity approved 03/21/2022-03/21/2023.

## 2022-10-05 ENCOUNTER — Encounter: Payer: Self-pay | Admitting: Nurse Practitioner

## 2022-10-05 ENCOUNTER — Ambulatory Visit: Payer: Medicare HMO | Admitting: Nurse Practitioner

## 2022-10-05 VITALS — BP 129/89 | HR 89 | Temp 98.2°F | Ht 66.0 in | Wt 190.0 lb

## 2022-10-05 DIAGNOSIS — E559 Vitamin D deficiency, unspecified: Secondary | ICD-10-CM | POA: Diagnosis not present

## 2022-10-05 DIAGNOSIS — E669 Obesity, unspecified: Secondary | ICD-10-CM

## 2022-10-05 DIAGNOSIS — R7303 Prediabetes: Secondary | ICD-10-CM

## 2022-10-05 DIAGNOSIS — Z6830 Body mass index (BMI) 30.0-30.9, adult: Secondary | ICD-10-CM | POA: Diagnosis not present

## 2022-10-05 DIAGNOSIS — I10 Essential (primary) hypertension: Secondary | ICD-10-CM | POA: Diagnosis not present

## 2022-10-05 NOTE — Progress Notes (Signed)
Office: [phone]  /  Fax: [phone]  WEIGHT SUMMARY AND BIOMETRICS  Weight Lost Since Last Visit: 2lb  Weight Gained Since Last Visit: 0lb   Vitals Temp: 98.2 F (36.8 C) BP: 129/89 Pulse Rate: 89 SpO2: 96 %   Anthropometric Measurements Height: 5\' 6"  (1.676 m) Weight: 190 lb (86.2 kg) BMI (Calculated): 30.68 Weight at Last Visit: 192lb Weight Lost Since Last Visit: 2lb Weight Gained Since Last Visit: 0lb Starting Weight: 194lb Total Weight Loss (lbs): 4 lb (1.814 kg)   Body Composition  Body Fat %: 36 % Fat Mass (lbs): 68.6 lbs Muscle Mass (lbs): 115.6 lbs Total Body Water (lbs): 69.2 lbs Visceral Fat Rating : 10   Other Clinical Data Fasting: No Labs: No Today's Visit #: 3 Starting Date: 09/07/22     HPI  Chief Complaint: OBESITY  Laura Kaufman is here to discuss her progress with her obesity treatment plan. She is on the the Category 3 Plan and states she is following her eating plan approximately 80 % of the time. She states she is exercising 0 minutes 0 days per week.   Interval History:  Since last office visit she has lost 2 pounds.  Her highest weight was 215 lbs. She struggles with eating all her calories.  She is eating 2-3 meals per day.  Eats a protein with each meal.  Drinking water, tea and 1 soda daily.    Pharmacotherapy for weight loss: She is not currently taking medications  for medical weight loss.    Previous pharmacotherapy for medical weight loss:  none  Bariatric surgery:  Patient has not had bariatric surgery  Prediabetes Last A1c was 5.9  Medication(s): Trulicity 0.75mg  (taken 2 doses)-notes occasional nausea.   Polyphagia:Yes Lab Results  Component Value Date   HGBA1C 5.9 (H) 09/07/2022   HGBA1C 5.6 03/03/2015   Lab Results  Component Value Date   INSULIN 27.1 (H) 09/07/2022   Hypertension Hypertension:  BP looks better today.  Medication(s): Cozaar 25mg .  Denies side effects.  Recently obtained a wrist BP cuff  and plans to monitor BP at home.   Denies chest pain, palpitations and SOB.  BP Readings from Last 3 Encounters:  10/05/22 129/89  09/21/22 (!) 146/83  09/07/22 (!) 148/85   Lab Results  Component Value Date   CREATININE 0.89 08/26/2022   CREATININE 0.78 08/01/2022   CREATININE 0.78 07/16/2020    Vit D deficiency  She is taking Vit D 50,000 IU weekly.  Denies side effects.  Denies nausea, vomiting or muscle weakness.    Lab Results  Component Value Date   VD25OH 12.7 (L) 08/26/2022    PHYSICAL EXAM:  Blood pressure 129/89, pulse 89, temperature 98.2 F (36.8 C), height 5\' 6"  (1.676 m), weight 190 lb (86.2 kg), SpO2 96%. Body mass index is 30.67 kg/m.  General: She is overweight, cooperative, alert, well developed, and in no acute distress. PSYCH: Has normal mood, affect and thought process.   Extremities: No edema.  Neurologic: No gross sensory or motor deficits. No tremors or fasciculations noted.    DIAGNOSTIC DATA REVIEWED:  BMET    Component Value Date/Time   NA 142 08/26/2022 1700   K 4.1 08/26/2022 1700   CL 102 08/26/2022 1700   CO2 23 08/26/2022 1700   GLUCOSE 151 (H) 08/26/2022 1700   GLUCOSE 102 (H) 07/16/2020 1137   BUN 12 08/26/2022 1700   CREATININE 0.89 08/26/2022 1700   CREATININE 0.78 07/16/2020 1137   CALCIUM 10.8 (H)  08/26/2022 1700   GFRNONAA >60 07/16/2020 1137   GFRAA 105 04/24/2019 0917   Lab Results  Component Value Date   HGBA1C 5.9 (H) 09/07/2022   HGBA1C 5.6 03/03/2015   Lab Results  Component Value Date   INSULIN 27.1 (H) 09/07/2022   Lab Results  Component Value Date   TSH 2.200 09/07/2022   CBC    Component Value Date/Time   WBC 8.1 09/07/2022 1021   WBC 10.1 02/03/2021 1307   RBC 5.88 (H) 09/07/2022 1021   RBC 5.44 (H) 02/03/2021 1307   HGB 15.1 09/07/2022 1021   HCT 46.8 (H) 09/07/2022 1021   PLT 237 09/07/2022 1021   MCV 80 09/07/2022 1021   MCH 25.7 (L) 09/07/2022 1021   MCH 27.6 02/03/2021 1307   MCHC 32.3  09/07/2022 1021   MCHC 33.0 02/03/2021 1307   RDW 13.2 09/07/2022 1021   Iron Studies No results found for: "IRON", "TIBC", "FERRITIN", "IRONPCTSAT" Lipid Panel     Component Value Date/Time   CHOL 133 09/07/2022 1021   TRIG 88 09/07/2022 1021   HDL 45 09/07/2022 1021   CHOLHDL 3.0 08/01/2022 1146   LDLCALC 71 09/07/2022 1021   Hepatic Function Panel     Component Value Date/Time   PROT 7.5 08/26/2022 1700   ALBUMIN 4.7 08/26/2022 1700   AST 33 08/26/2022 1700   AST 40 07/16/2020 1137   ALT 45 (H) 08/26/2022 1700   ALT 57 (H) 07/16/2020 1137   ALKPHOS 162 (H) 08/26/2022 1700   BILITOT 0.6 08/26/2022 1700   BILITOT 0.5 07/16/2020 1137      Component Value Date/Time   TSH 2.200 09/07/2022 1021   Nutritional Lab Results  Component Value Date   VD25OH 12.7 (L) 08/26/2022     ASSESSMENT AND PLAN  TREATMENT PLAN FOR OBESITY:  Recommended Dietary Goals  Laura Kaufman is currently in the action stage of change. As such, her goal is to continue weight management plan. She has agreed to the Category 3 Plan.  Behavioral Intervention  We discussed the following Behavioral Modification Strategies today: increasing lean protein intake, decreasing simple carbohydrates , increasing vegetables, increasing lower glycemic fruits, avoiding skipping meals, increasing water intake, continue to practice mindfulness when eating, and planning for success.  Additional resources provided today: NA  Recommended Physical Activity Goals  Laura Kaufman has been advised to work up to 150 minutes of moderate intensity aerobic activity a week and strengthening exercises 2-3 times per week for cardiovascular health, weight loss maintenance and preservation of muscle mass.   She has agreed to Think about ways to increase daily physical activity and overcoming barriers to exercise and Increase physical activity in their day and reduce sedentary time (increase NEAT).    ASSOCIATED CONDITIONS ADDRESSED  TODAY  Action/Plan  Prediabetes Continue Trulicity.  Side effects discussed  Laura Kaufman will continue to work on weight loss, exercise, and decreasing simple carbohydrates to help decrease the risk of diabetes.    Primary hypertension Continue to follow up with PCP.  Continue meds as directed  Vitamin D deficiency Continue Vit D as directed  Generalized obesity  BMI 30.0-30.9,adult         Return in about 3 weeks (around 10/26/2022).Marland Kitchen She was informed of the importance of frequent follow up visits to maximize her success with intensive lifestyle modifications for her multiple health conditions.   ATTESTASTION STATEMENTS:  Reviewed by clinician on day of visit: allergies, medications, problem list, medical history, surgical history, family history, social history, and  previous encounter notes.   Time spent on visit including pre-visit chart review and post-visit care and charting was 30 minutes.    Theodis Sato. Carmesha Morocco FNP-C

## 2022-10-08 DIAGNOSIS — N182 Chronic kidney disease, stage 2 (mild): Secondary | ICD-10-CM | POA: Diagnosis not present

## 2022-10-08 DIAGNOSIS — Z8249 Family history of ischemic heart disease and other diseases of the circulatory system: Secondary | ICD-10-CM | POA: Diagnosis not present

## 2022-10-08 DIAGNOSIS — Z87891 Personal history of nicotine dependence: Secondary | ICD-10-CM | POA: Diagnosis not present

## 2022-10-08 DIAGNOSIS — L409 Psoriasis, unspecified: Secondary | ICD-10-CM | POA: Diagnosis not present

## 2022-10-08 DIAGNOSIS — E785 Hyperlipidemia, unspecified: Secondary | ICD-10-CM | POA: Diagnosis not present

## 2022-10-08 DIAGNOSIS — M810 Age-related osteoporosis without current pathological fracture: Secondary | ICD-10-CM | POA: Diagnosis not present

## 2022-10-08 DIAGNOSIS — M1990 Unspecified osteoarthritis, unspecified site: Secondary | ICD-10-CM | POA: Diagnosis not present

## 2022-10-08 DIAGNOSIS — Z809 Family history of malignant neoplasm, unspecified: Secondary | ICD-10-CM | POA: Diagnosis not present

## 2022-10-08 DIAGNOSIS — E669 Obesity, unspecified: Secondary | ICD-10-CM | POA: Diagnosis not present

## 2022-10-08 DIAGNOSIS — E559 Vitamin D deficiency, unspecified: Secondary | ICD-10-CM | POA: Diagnosis not present

## 2022-10-08 DIAGNOSIS — K219 Gastro-esophageal reflux disease without esophagitis: Secondary | ICD-10-CM | POA: Diagnosis not present

## 2022-10-08 DIAGNOSIS — E1122 Type 2 diabetes mellitus with diabetic chronic kidney disease: Secondary | ICD-10-CM | POA: Diagnosis not present

## 2022-10-19 ENCOUNTER — Other Ambulatory Visit: Payer: Self-pay | Admitting: Bariatrics

## 2022-10-19 DIAGNOSIS — R7303 Prediabetes: Secondary | ICD-10-CM

## 2022-10-25 ENCOUNTER — Ambulatory Visit: Payer: Medicare HMO | Admitting: Bariatrics

## 2022-10-27 ENCOUNTER — Ambulatory Visit: Payer: Medicare HMO | Admitting: Bariatrics

## 2022-10-31 ENCOUNTER — Encounter: Payer: Self-pay | Admitting: Bariatrics

## 2022-10-31 ENCOUNTER — Ambulatory Visit (INDEPENDENT_AMBULATORY_CARE_PROVIDER_SITE_OTHER): Payer: Medicare HMO | Admitting: Bariatrics

## 2022-10-31 VITALS — BP 137/87 | HR 88 | Temp 98.2°F | Ht 66.0 in | Wt 187.0 lb

## 2022-10-31 DIAGNOSIS — E559 Vitamin D deficiency, unspecified: Secondary | ICD-10-CM | POA: Diagnosis not present

## 2022-10-31 DIAGNOSIS — I10 Essential (primary) hypertension: Secondary | ICD-10-CM

## 2022-10-31 DIAGNOSIS — E669 Obesity, unspecified: Secondary | ICD-10-CM | POA: Diagnosis not present

## 2022-10-31 DIAGNOSIS — Z6830 Body mass index (BMI) 30.0-30.9, adult: Secondary | ICD-10-CM | POA: Diagnosis not present

## 2022-10-31 MED ORDER — VITAMIN D (ERGOCALCIFEROL) 1.25 MG (50000 UNIT) PO CAPS
50000.0000 [IU] | ORAL_CAPSULE | ORAL | 0 refills | Status: DC
Start: 2022-10-31 — End: 2022-11-17

## 2022-10-31 NOTE — Progress Notes (Signed)
WEIGHT SUMMARY AND BIOMETRICS  Weight Lost Since Last Visit: 3lb  Weight Gained Since Last Visit: 0lb   Vitals Temp: 98.2 F (36.8 C) BP: 137/87 Pulse Rate: 88 SpO2: 98 %   Anthropometric Measurements Height: 5\' 6"  (1.676 m) Weight: 187 lb (84.8 kg) BMI (Calculated): 30.2 Weight at Last Visit: 190lb Weight Lost Since Last Visit: 3lb Weight Gained Since Last Visit: 0lb Starting Weight: 194lb Total Weight Loss (lbs): 7 lb (3.175 kg)   Body Composition  Body Fat %: 37.1 % Fat Mass (lbs): 69.6 lbs Muscle Mass (lbs): 112 lbs Total Body Water (lbs): 69 lbs Visceral Fat Rating : 11   Other Clinical Data Fasting: No Labs: No Today's Visit #: 4 Starting Date: 09/07/22    OBESITY Laura Kaufman Kaufman here to discuss her progress with her obesity treatment plan along with follow-up of her obesity related diagnoses.     Nutrition Plan: the Category 3 plan - 50% adherence.  Current exercise: none  Interim History:  Laura Kaufman down 3 lbs since her last visit.  Protein intake Kaufman as prescribed, Water intake Kaufman adequate., and Denies polyphagia  Pharmacotherapy: Laura Kaufman Kaufman on Trulicity 0.75 mg SQ weekly Adverse side effects: None Hunger Kaufman moderately controlled.  Cravings are moderately controlled.  Assessment/Plan:   1. Vitamin D deficiency Vitamin D Deficiency Vitamin D Kaufman not at goal of 50.  Most recent vitamin D level was 12.7. Laura Kaufman on  prescription ergocalciferol 50,000 IU weekly. Lab Results  Component Value Date   VD25OH 12.7 (L) 08/26/2022    Plan: Refill prescription vitamin D 50,000 IU weekly.   Hypertension Hypertension control uncertain.  Medication(s): Losartan Cozaar 25 mg once daily   BP Readings from Last 3 Encounters:  10/31/22 137/87  10/05/22 129/89  09/21/22 (!) 146/83   Lab Results  Component Value Date   CREATININE 0.89 08/26/2022   CREATININE 0.78 08/01/2022   CREATININE 0.78 07/16/2020   No results found for:  "GFR"  Plan: Continue all antihypertensives at current dosages. No added salt. Will keep sodium content to 1,500 mg or less per day.     Generalized Obesity: Current BMI BMI (Calculated): 30.2   Pharmacotherapy Plan Continue  Trulicity 0.75 mg SQ weekly Will increase the Trulicity in the future.   Laura Kaufman Kaufman currently in the action stage of change. As such, her goal Kaufman to continue with weight loss efforts.  Laura has agreed to the Category 3 plan.  Exercise goals: Older adults should determine their level of effort for physical activity relative to their level of fitness.  Laura has been exercising in the pool some.   Behavioral modification strategies: increasing lean protein intake, decreasing simple carbohydrates , no meal skipping, meal planning , increase water intake, better snacking choices, planning for success, increasing fiber rich foods, keep healthy foods in the home, and mindful eating.  Shelle has agreed to follow-up with our clinic in 2 weeks.    Objective:   VITALS: Per patient if applicable, see vitals. GENERAL: Alert and in no acute distress. CARDIOPULMONARY: No increased WOB. Speaking in clear sentences.  PSYCH: Pleasant and cooperative. Speech normal rate and rhythm. Affect Kaufman appropriate. Insight and judgement are appropriate. Attention Kaufman focused, linear, and appropriate.  NEURO: Oriented as arrived to appointment on time with no prompting.   Attestation Statements:   This was prepared with the assistance of Engineer, civil (consulting).  Occasional wrong-word or sound-a-like substitutions may have occurred due to the inherent limitations of voice recognition  software. Laura Capra, DO

## 2022-11-17 ENCOUNTER — Encounter: Payer: Self-pay | Admitting: Bariatrics

## 2022-11-17 ENCOUNTER — Ambulatory Visit (INDEPENDENT_AMBULATORY_CARE_PROVIDER_SITE_OTHER): Payer: Medicare HMO | Admitting: Bariatrics

## 2022-11-17 VITALS — BP 145/91 | HR 82 | Temp 98.0°F | Ht 66.0 in | Wt 187.0 lb

## 2022-11-17 DIAGNOSIS — Z6830 Body mass index (BMI) 30.0-30.9, adult: Secondary | ICD-10-CM | POA: Diagnosis not present

## 2022-11-17 DIAGNOSIS — E559 Vitamin D deficiency, unspecified: Secondary | ICD-10-CM | POA: Diagnosis not present

## 2022-11-17 DIAGNOSIS — R7303 Prediabetes: Secondary | ICD-10-CM

## 2022-11-17 DIAGNOSIS — E669 Obesity, unspecified: Secondary | ICD-10-CM

## 2022-11-17 MED ORDER — VITAMIN D (ERGOCALCIFEROL) 1.25 MG (50000 UNIT) PO CAPS: 50000.0000 [IU] | ORAL_CAPSULE | ORAL | 0 refills | Status: DC

## 2022-11-17 MED ORDER — TRULICITY 1.5 MG/0.5ML ~~LOC~~ SOAJ: 1.5000 mg | SUBCUTANEOUS | 0 refills | Status: DC

## 2022-11-17 NOTE — Progress Notes (Signed)
WEIGHT SUMMARY AND BIOMETRICS  Weight Lost Since Last Visit: 0  Weight Gained Since Last Visit: 0   Vitals Temp: 98 F (36.7 C) BP: (!) 145/91 Pulse Rate: 82 SpO2: 98 %   Anthropometric Measurements Height: 5\' 6"  (1.676 m) Weight: 187 lb (84.8 kg) BMI (Calculated): 30.2 Weight at Last Visit: 187lb Weight Lost Since Last Visit: 0 Weight Gained Since Last Visit: 0 Starting Weight: 194lb Total Weight Loss (lbs): 7 lb (3.175 kg)   Body Composition  Body Fat %: 36.5 % Fat Mass (lbs): 68.4 lbs Muscle Mass (lbs): 112.8 lbs Total Body Water (lbs): 68.4 lbs Visceral Fat Rating : 10   Other Clinical Data Fasting: no Labs: no Today's Visit #: 5 Starting Date: 09/07/22    OBESITY Zimal is here to discuss her progress with her obesity treatment plan along with follow-up of her obesity related diagnoses.     Nutrition Plan: the Category 3 plan - 60% adherence.  Current exercise: none  Interim History:  Her weight remains the same. She is been " running " and more active.  Eating all of the food on the plan., Protein intake is as prescribed, Is skipping meals, Water intake is adequate., and Denies polyphagia  Pharmacotherapy: Azarya is on Trulicity 0.75 mg SQ weekly Adverse side effects: None Hunger is moderately controlled.  Cravings are moderately controlled.  Assessment/Plan:   1. Vitamin D deficiency Vitamin D Deficiency Vitamin D is not at goal of 50.  Most recent vitamin D level was 12.7. She is on  prescription ergocalciferol 50,000 IU weekly. Lab Results  Component Value Date   VD25OH 12.7 (L) 08/26/2022    Plan: Refill prescription vitamin D 50,000 IU weekly.  Information sheet for vitamin D and vitamin B.    2. Prediabetes Prediabetes Last A1c was 5.9  Medication(s): Trulicity 1.5 mg SQ weekly Lab Results  Component Value Date   HGBA1C 5.9 (H) 09/07/2022   HGBA1C 5.6 03/03/2015   Lab Results  Component Value Date    INSULIN 27.1 (H) 09/07/2022    Plan: Will minimize all refined carbohydrates both sweets and starches.  Will work on the plan and exercise  Discussed both aerobic and resistance training.  Will keep protein, water, and fiber intake high.  Increase Polyunsaturated and Monounsaturated fats to increase satiety and encourage weight loss.  Aim for 7 to 9 hours of sleep nightly.  Will continue medications.   Continue and refill Trulicity 1.5 mg SQ weekly      Generalized Obesity: Current BMI BMI (Calculated): 30.2   Vitamin sheet for vitamin D, B vitamins.    Pharmacotherapy Plan Continue and increase dose  Trulicity 1.5 mg SQ weekly  Kaithlyn is currently in the action stage of change. As such, her goal is to continue with weight loss efforts.  She has agreed to the Category 3 plan.  Exercise goals: Older adults should follow the adult guidelines. When older adults cannot meet the adult guidelines, they should be as physically active as their abilities and conditions will allow.   Behavioral modification strategies: increasing lean protein intake, no meal skipping, meal planning , better snacking choices, increasing vegetables, increasing fiber rich foods, keep healthy foods in the home, weigh protein portions, measure portion sizes, and mindful eating.  Maesyn has agreed to follow-up with our clinic in 4 weeks.      Objective:   VITALS: Per patient if applicable, see vitals. GENERAL: Alert and in no acute distress. CARDIOPULMONARY: No increased WOB. Speaking  in clear sentences.  PSYCH: Pleasant and cooperative. Speech normal rate and rhythm. Affect is appropriate. Insight and judgement are appropriate. Attention is focused, linear, and appropriate.  NEURO: Oriented as arrived to appointment on time with no prompting.   Attestation Statements:   This was prepared with the assistance of Engineer, civil (consulting).  Occasional wrong-word or sound-a-like substitutions may have  occurred due to the inherent limitations of voice recognition software.   Corinna Capra, DO

## 2022-11-29 ENCOUNTER — Telehealth (INDEPENDENT_AMBULATORY_CARE_PROVIDER_SITE_OTHER): Payer: Self-pay | Admitting: Bariatrics

## 2022-11-29 NOTE — Telephone Encounter (Signed)
The patient stated she has two boxes of Trulicity and is unsure which one to take first. Should she take the lower dosage back to the pharmacy?

## 2022-11-29 NOTE — Telephone Encounter (Signed)
Notified patient that Dr. Manson Passey moved her to 1.5 mg dosage at her last visit. Patient verbalized understanding and stated she will take the 0.75 mg dosage back to pharmacy and start on the 1.5 mg dosage.

## 2022-12-11 ENCOUNTER — Other Ambulatory Visit: Payer: Self-pay | Admitting: Bariatrics

## 2022-12-21 ENCOUNTER — Ambulatory Visit (INDEPENDENT_AMBULATORY_CARE_PROVIDER_SITE_OTHER): Payer: Medicare HMO | Admitting: Family Medicine

## 2022-12-21 ENCOUNTER — Encounter: Payer: Self-pay | Admitting: Family Medicine

## 2022-12-21 VITALS — BP 145/88 | HR 98 | Temp 98.2°F | Ht 66.0 in | Wt 181.0 lb

## 2022-12-21 DIAGNOSIS — I10 Essential (primary) hypertension: Secondary | ICD-10-CM

## 2022-12-21 DIAGNOSIS — R7303 Prediabetes: Secondary | ICD-10-CM

## 2022-12-21 DIAGNOSIS — E559 Vitamin D deficiency, unspecified: Secondary | ICD-10-CM

## 2022-12-21 DIAGNOSIS — Z6829 Body mass index (BMI) 29.0-29.9, adult: Secondary | ICD-10-CM

## 2022-12-21 DIAGNOSIS — E66811 Obesity, class 1: Secondary | ICD-10-CM | POA: Insufficient documentation

## 2022-12-21 MED ORDER — VITAMIN D (ERGOCALCIFEROL) 1.25 MG (50000 UNIT) PO CAPS: 50000.0000 [IU] | ORAL_CAPSULE | ORAL | 0 refills | Status: DC

## 2022-12-21 MED ORDER — TRULICITY 1.5 MG/0.5ML ~~LOC~~ SOAJ
1.5000 mg | SUBCUTANEOUS | 0 refills | Status: DC
Start: 2022-12-21 — End: 2023-02-14

## 2022-12-21 NOTE — Assessment & Plan Note (Signed)
Lab Results  Component Value Date   HGBA1C 5.9 (H) 09/07/2022   She is doing well with weight reduction, increased walking time and use of Trulicity at 1.5 mg weekly injection, increased 4 weeks ago and doing well without GI SE  Continue Trulicity at 1.5 mg weekly Update CMP, A1c next visit Increase walking time

## 2022-12-21 NOTE — Assessment & Plan Note (Signed)
BP is elevated today on Losartan 25 mg daily Denies CP or DOE Stress levels are high with husband in hospital and a lack of sleep  Continue current meds

## 2022-12-21 NOTE — Assessment & Plan Note (Signed)
Last vitamin D Lab Results  Component Value Date   VD25OH 12.7 (L) 08/26/2022   Doing well on RX vitamin D weekly Energy level improving  Repeat vitamin D level next visit

## 2022-12-21 NOTE — Progress Notes (Signed)
Office: [phone]  /  Fax: [phone]  WEIGHT SUMMARY AND BIOMETRICS  Starting Date: 09/07/22  Starting Weight: 194lb   Weight Lost Since Last Visit: 6lb   Vitals Temp: 98.2 F (36.8 C) BP: (!) 145/88 Pulse Rate: 98 SpO2: 100 %   Body Composition  Body Fat %: 42.9 % Fat Mass (lbs): 78 lbs Muscle Mass (lbs): 98.6 lbs Total Body Water (lbs): 65.2 lbs Visceral Fat Rating : 11   HPI  Chief Complaint: OBESITY  Laura Kaufman is here to discuss her progress with her obesity treatment plan. She is on the the Category 3 Plan and states she is following her eating plan approximately 20 % of the time. She states she is exercising 30 minutes 5-7 times per week.   Interval History:  Since last office visit she is down 6 lb Her husband had a stroke during heart surgery since her last visit She did increase her Trulcity to 1.5 mg weekly 4 weeks ago and this is working well Her husband is still in the hospital and she has carried some snacks while not eating real meals Sleep has been lacking lately but she is in good spirits She is drinking water She is down 13 lb in past 3 mos of medically supervised weight management She has been walking while going to and from the hospital  Pharmacotherapy: Trulicity 1.5 mg weekly  PHYSICAL EXAM:  Blood pressure (!) 145/88, pulse 98, temperature 98.2 F (36.8 C), height 5\' 6"  (1.676 m), weight 181 lb (82.1 kg), SpO2 100%. Body mass index is 29.21 kg/m.  General: She is overweight, cooperative, alert, well developed, and in no acute distress. PSYCH: Has normal mood, affect and thought process.   Lungs: Normal breathing effort, no conversational dyspnea.   ASSESSMENT AND PLAN  TREATMENT PLAN FOR OBESITY:  Recommended Dietary Goals  Laura Kaufman is currently in the action stage of change. As such, her goal is to continue weight management plan. She has agreed to the Category 3 Plan.  Behavioral Intervention  We discussed the following  Behavioral Modification Strategies today: increasing lean protein intake, decreasing simple carbohydrates , increasing vegetables, increasing lower glycemic fruits, increasing fiber rich foods, avoiding skipping meals, increasing water intake, keeping healthy foods at home, continue to practice mindfulness when eating, and planning for success.  Additional resources provided today: NA  Recommended Physical Activity Goals  Laura Kaufman has been advised to work up to 150 minutes of moderate intensity aerobic activity a week and strengthening exercises 2-3 times per week for cardiovascular health, weight loss maintenance and preservation of muscle mass.   She has agreed to Think about ways to increase daily physical activity and overcoming barriers to exercise  Pharmacotherapy changes for the treatment of obesity: none  ASSOCIATED CONDITIONS ADDRESSED TODAY  Prediabetes Assessment & Plan: Lab Results  Component Value Date   HGBA1C 5.9 (H) 09/07/2022   She is doing well with weight reduction, increased walking time and use of Trulicity at 1.5 mg weekly injection, increased 4 weeks ago and doing well without GI SE  Continue Trulicity at 1.5 mg weekly Update CMP, A1c next visit Increase walking time   Orders: -     Trulicity; Inject 1.5 mg into the skin once a week.  Dispense: 2 mL; Refill: 0  Vitamin D deficiency Assessment & Plan: Last vitamin D Lab Results  Component Value Date   VD25OH 12.7 (L) 08/26/2022   Doing well on RX vitamin D weekly Energy level improving  Repeat vitamin D  level next visit  Orders: -     Vitamin D (Ergocalciferol); Take 1 capsule (50,000 Units total) by mouth every 7 (seven) days.  Dispense: 5 capsule; Refill: 0  Class 1 obesity due to excess calories with serious comorbidity and body mass index (BMI) of 31.0 to 31.9 in adult  Primary hypertension Assessment & Plan: BP is elevated today on Losartan 25 mg daily Denies CP or DOE Stress levels are high  with husband in hospital and a lack of sleep  Continue current meds       She was informed of the importance of frequent follow up visits to maximize her success with intensive lifestyle modifications for her multiple health conditions.   ATTESTASTION STATEMENTS:  Reviewed by clinician on day of visit: allergies, medications, problem list, medical history, surgical history, family history, social history, and previous encounter notes pertinent to obesity diagnosis.   I have personally spent 30 minutes total time today in preparation, patient care, nutritional counseling and documentation for this visit, including the following: review of clinical lab tests; review of medical tests/procedures/services.      Glennis Brink, DO DABFM, DABOM [facility] [address] [place], [place] [address] [phone]

## 2023-01-13 ENCOUNTER — Ambulatory Visit (INDEPENDENT_AMBULATORY_CARE_PROVIDER_SITE_OTHER): Payer: Medicare HMO | Admitting: Family Medicine

## 2023-01-13 ENCOUNTER — Encounter: Payer: Self-pay | Admitting: Family Medicine

## 2023-01-13 VITALS — BP 127/91 | HR 95 | Ht 66.0 in | Wt 184.4 lb

## 2023-01-13 DIAGNOSIS — R300 Dysuria: Secondary | ICD-10-CM | POA: Diagnosis not present

## 2023-01-13 LAB — POCT URINALYSIS DIP (MANUAL ENTRY)
Bilirubin, UA: NEGATIVE
Glucose, UA: NEGATIVE mg/dL
Ketones, POC UA: NEGATIVE mg/dL
Leukocytes, UA: NEGATIVE
Nitrite, UA: NEGATIVE
Protein Ur, POC: NEGATIVE mg/dL
Spec Grav, UA: 1.01 (ref 1.010–1.025)
Urobilinogen, UA: 0.2 U/dL
pH, UA: 5.5 (ref 5.0–8.0)

## 2023-01-13 LAB — POCT UA - MICROSCOPIC ONLY

## 2023-01-13 MED ORDER — CEFADROXIL 500 MG PO CAPS: 500.0000 mg | ORAL_CAPSULE | Freq: Two times a day (BID) | ORAL | 0 refills | Status: AC

## 2023-01-13 NOTE — Patient Instructions (Signed)
It was great to see you!  Our plans for today:  - We sent an antibiotic to your pharmacy. We will let you know if we have to change your antibiotic based on your culture results.  - Avoid excess caffeine, alcohol, artificial sweeteners as these can irritate your bladder. - come back for follow up with your regular doctor in a few months.   We are checking some labs today, we will release these results to your MyChart.  Take care and seek immediate care sooner if you develop any concerns.   Dr. Linwood Dibbles

## 2023-01-13 NOTE — Progress Notes (Signed)
   SUBJECTIVE:   CHIEF COMPLAINT / HPI:   URINARY SYMPTOMS Duration: few days Dysuria: burning Urinary frequency:  unsure Urgency: yes Hematuria: no Abdominal pain: no Back pain: not new Suprapubic pain/pressure: no Flank pain: no Fever:  no Vomiting: no Relief with cranberry juice: no Relief with pyridium: not taken Previous urinary tract infection: yes Recurrent urinary tract infection: no Vaginal discharge: no Treatments attempted: cranberry    OBJECTIVE:   BP (!) 127/91   Pulse 95   Ht 5\' 6"  (1.676 m)   Wt 184 lb 6.4 oz (83.6 kg)   SpO2 99%   BMI 29.76 kg/m   Gen: well appearing, in NAD Card: Reg rate Lungs: Comfortable WOB on RA Ext: WWP, no edema  ASSESSMENT/PLAN:   Dysuria UA not convincing for infection but given classic symptoms, will treat and send for culture. Will adjust treatment based on culture results. Also recommended to avoid bladder irritants during this time. F/u few months for chronic disease follow up with PCP, recommend recheck urine at that time once asymptomatic due to hematuria on today's sample.    Caro Laroche, DO

## 2023-01-17 ENCOUNTER — Encounter: Payer: Self-pay | Admitting: Bariatrics

## 2023-01-17 ENCOUNTER — Ambulatory Visit (INDEPENDENT_AMBULATORY_CARE_PROVIDER_SITE_OTHER): Payer: Medicare HMO | Admitting: Bariatrics

## 2023-01-17 VITALS — BP 130/80 | HR 86 | Temp 97.8°F | Ht 66.0 in | Wt 181.0 lb

## 2023-01-17 DIAGNOSIS — Z6829 Body mass index (BMI) 29.0-29.9, adult: Secondary | ICD-10-CM | POA: Diagnosis not present

## 2023-01-17 DIAGNOSIS — E559 Vitamin D deficiency, unspecified: Secondary | ICD-10-CM

## 2023-01-17 DIAGNOSIS — E669 Obesity, unspecified: Secondary | ICD-10-CM | POA: Diagnosis not present

## 2023-01-17 DIAGNOSIS — I10 Essential (primary) hypertension: Secondary | ICD-10-CM | POA: Diagnosis not present

## 2023-01-17 DIAGNOSIS — R7303 Prediabetes: Secondary | ICD-10-CM

## 2023-01-17 MED ORDER — TRULICITY 3 MG/0.5ML ~~LOC~~ SOAJ: 3.0000 mg | SUBCUTANEOUS | 0 refills | Status: DC

## 2023-01-17 MED ORDER — VITAMIN D (ERGOCALCIFEROL) 1.25 MG (50000 UNIT) PO CAPS: 50000.0000 [IU] | ORAL_CAPSULE | ORAL | 0 refills | Status: DC

## 2023-01-17 NOTE — Progress Notes (Signed)
WEIGHT SUMMARY AND BIOMETRICS  Weight Lost Since Last Visit: 0  Weight Gained Since Last Visit: 0   Vitals Temp: 97.8 F (36.6 C) BP: (!) 145/88 Pulse Rate: 86 SpO2: 97 %   Anthropometric Measurements Height: 5\' 6"  (1.676 m) Weight: 181 lb (82.1 kg) BMI (Calculated): 29.23 Weight at Last Visit: 181lb Weight Lost Since Last Visit: 0 Weight Gained Since Last Visit: 0 Starting Weight: 194lb Total Weight Loss (lbs): 13 lb (5.897 kg)   Body Composition  Body Fat %: 43.1 % Fat Mass (lbs): 78 lbs Muscle Mass (lbs): 97.8 lbs Total Body Water (lbs): 66.6 lbs Visceral Fat Rating : 11   Other Clinical Data Fasting: no Labs: no Today's Visit #: 7 Starting Date: 09/07/22    OBESITY Taressa is here to discuss her progress with her obesity treatment plan along with follow-up of her obesity related diagnoses.     Nutrition Plan: the Category 3 plan - 65% adherence.  Current exercise: walking  Interim History:  Her weight remains the same as she has been under a lot stress.  Not eating all of the food on the plan., Protein intake is as prescribed, Is skipping meals, and Water intake is inadequate.  Pharmacotherapy: Samuel is on Trulicity 3.0 mg SQ weekly Adverse side effects: None Hunger is moderately controlled.  Cravings are moderately controlled.  Assessment/Plan:   1. Vitamin D deficiency Vitamin D Deficiency Vitamin D is not at goal of 50.  Most recent vitamin D level was 12.7. She is on  prescription ergocalciferol 50,000 IU weekly. Lab Results  Component Value Date   VD25OH 12.7 (L) 08/26/2022    Plan: Refill prescription vitamin D 50,000 IU weekly.   2. Prediabetes Prediabetes Last A1c was 5.9  Medication(s): Trulicity 1.5 mg   Lab Results  Component Value Date   HGBA1C 5.9 (H) 09/07/2022   HGBA1C 5.6 03/03/2015   Lab Results  Component Value Date   INSULIN 27.1 (H) 09/07/2022    Plan: Will minimize all refined carbohydrates  both sweets and starches.  Will work on the plan and exercise.  Consider both aerobic and resistance training.  Will keep protein, water, and fiber intake high.  Increase Polyunsaturated and Monounsaturated fats to increase satiety and encourage weight loss.  Aim for 7 to 9 hours of sleep nightly.  Will continue medications.   Continue and increase dose Trulicity 3.0 mg SQ weekly   3. Hypertension Hypertension control uncertain.  Medication(s): Cozaar 25 mg daily  She states that she is normally well controlled, but under extreme stress. She is taking her medications as directed.  Her second blood pressure reading was 130/80 BP Readings from Last 3 Encounters:  01/17/23 (!) 145/88  01/13/23 (!) 127/91  12/21/22 (!) 145/88   Lab Results  Component Value Date   CREATININE 0.89 08/26/2022   CREATININE 0.78 08/01/2022   CREATININE 0.78 07/16/2020    Plan: Continue all antihypertensives at current dosages. She will check her blood pressures.  No added salt. Will keep sodium content to 1,500 mg or less per day.   She will increase her water.    Generalized Obesity: Current BMI BMI (Calculated): 29.23   Pharmacotherapy Plan Continue and increase dose  Trulicity 3.0 mg SQ weekly  Wendee is currently in the action stage of change. As such, her goal is to continue with weight loss efforts.  She has agreed to the Category 3 plan.  Exercise goals: Older adults should determine their level of effort for  physical activity relative to their level of fitness.  She will continue to walk.   Behavioral modification strategies: increasing lean protein intake, meal planning , and mindful eating.  Tocarra has agreed to follow-up with our clinic in 4 weeks.     Objective:   VITALS: Per patient if applicable, see vitals. GENERAL: Alert and in no acute distress. CARDIOPULMONARY: No increased WOB. Speaking in clear sentences.  PSYCH: Pleasant and cooperative. Speech normal rate and  rhythm. Affect is appropriate. Insight and judgement are appropriate. Attention is focused, linear, and appropriate.  NEURO: Oriented as arrived to appointment on time with no prompting.   Attestation Statements:   This was prepared with the assistance of Engineer, civil (consulting).  Occasional wrong-word or sound-a-like substitutions may have occurred due to the inherent limitations of voice recognition software.   Corinna Capra, DO

## 2023-01-19 ENCOUNTER — Encounter: Payer: Self-pay | Admitting: Family Medicine

## 2023-01-19 LAB — URINE CULTURE

## 2023-01-19 MED ORDER — NITROFURANTOIN MONOHYD MACRO 100 MG PO CAPS: 100.0000 mg | ORAL_CAPSULE | Freq: Two times a day (BID) | ORAL | 0 refills | Status: AC

## 2023-02-14 ENCOUNTER — Encounter: Payer: Self-pay | Admitting: Bariatrics

## 2023-02-14 ENCOUNTER — Ambulatory Visit (INDEPENDENT_AMBULATORY_CARE_PROVIDER_SITE_OTHER): Payer: Medicare HMO | Admitting: Bariatrics

## 2023-02-14 DIAGNOSIS — Z6829 Body mass index (BMI) 29.0-29.9, adult: Secondary | ICD-10-CM | POA: Diagnosis not present

## 2023-02-14 DIAGNOSIS — E669 Obesity, unspecified: Secondary | ICD-10-CM

## 2023-02-14 DIAGNOSIS — E559 Vitamin D deficiency, unspecified: Secondary | ICD-10-CM | POA: Diagnosis not present

## 2023-02-14 MED ORDER — VITAMIN D (ERGOCALCIFEROL) 1.25 MG (50000 UNIT) PO CAPS: 50000.0000 [IU] | ORAL_CAPSULE | ORAL | 0 refills | Status: DC

## 2023-02-14 MED ORDER — TRULICITY 3 MG/0.5ML ~~LOC~~ SOAJ: 3.0000 mg | SUBCUTANEOUS | 0 refills | Status: DC

## 2023-02-14 NOTE — Progress Notes (Signed)
                                                                                                              WEIGHT SUMMARY AND BIOMETRICS  Weight Lost Since Last Visit: 1lb  Weight Gained Since Last Visit: 0lb   Vitals Temp: 98 F (36.7 C) BP: (!) 124/0 Pulse Rate: 93 SpO2: 97 %   Anthropometric Measurements Height: 5\' 6"  (1.676 m) Weight: 180 lb (81.6 kg) BMI (Calculated): 29.07 Weight at Last Visit: 181lb Weight Lost Since Last Visit: 1lb Weight Gained Since Last Visit: 0lb Starting Weight: 194lb Total Weight Loss (lbs): 14 lb (6.35 kg)   Body Composition  Body Fat %: 42.7 % Fat Mass (lbs): 77 lbs Muscle Mass (lbs): 98 lbs Total Body Water (lbs): 65.6 lbs Visceral Fat Rating : 11   Other Clinical Data Fasting: No Labs: No Today's Visit #: 8 Starting Date: 09/07/22    OBESITY [name] is here to discuss her progress with her obesity treatment plan along with follow-up of her obesity related diagnoses.    Nutrition Plan: the Category 3 plan - 70% adherence.  Current exercise: walking  Interim History:  She is down 1 lb since her last visit. She wants to lose 30 lbs. She is eating more protein, but not enough vegetables.  Eating all of the food on the plan., Is not skipping meals, and Water intake is adequate.   Pharmacotherapy: [name] is on Trulicity 3.0 mg SQ weekly Adverse side effects: None Hunger is moderately controlled.  Cravings are moderately controlled.  Assessment/Plan:     Generalized Obesity: Current BMI BMI (Calculated): 29.07   Pharmacotherapy Plan Continue and refill  Trulicity 3.0 mg SQ weekly  [name] is currently in the action stage of change. As such, her goal is to continue with weight loss efforts.  She has agreed to the Category 3 plan.  Exercise goals: Older adults should do exercises that maintain or improve balance if they are at risk of falling.   Behavioral modification strategies: increasing lean protein intake,  meal planning , better snacking choices, planning for success, increasing vegetables, get rid of junk food in the home, ways to avoid boredom eating, increase frequency of journaling, weigh protein portions, and mindful eating.  [name] has agreed to follow-up with our clinic in 4 weeks.        Objective:   VITALS: Per patient if applicable, see vitals. GENERAL: Alert and in no acute distress. CARDIOPULMONARY: No increased WOB. Speaking in clear sentences.  PSYCH: Pleasant and cooperative. Speech normal rate and rhythm. Affect is appropriate. Insight and judgement are appropriate. Attention is focused, linear, and appropriate.  NEURO: Oriented as arrived to appointment on time with no prompting.   Attestation Statements:   This was prepared with the assistance of Engineer, civil (consulting).  Occasional wrong-word or sound-a-like substitutions may have occurred due to the inherent limitations of voice recognition   Corinna Capra, DO

## 2023-03-27 ENCOUNTER — Ambulatory Visit: Payer: Medicare HMO | Admitting: Student

## 2023-03-28 ENCOUNTER — Ambulatory Visit: Payer: Medicare HMO | Admitting: Bariatrics

## 2023-03-30 ENCOUNTER — Telehealth: Payer: Self-pay | Admitting: Bariatrics

## 2023-03-30 ENCOUNTER — Encounter: Payer: Self-pay | Admitting: Bariatrics

## 2023-03-30 ENCOUNTER — Ambulatory Visit (INDEPENDENT_AMBULATORY_CARE_PROVIDER_SITE_OTHER): Payer: Medicare HMO | Admitting: Bariatrics

## 2023-03-30 VITALS — BP 124/74 | HR 85 | Temp 97.6°F | Ht 66.0 in | Wt 176.0 lb

## 2023-03-30 DIAGNOSIS — E559 Vitamin D deficiency, unspecified: Secondary | ICD-10-CM

## 2023-03-30 DIAGNOSIS — R7303 Prediabetes: Secondary | ICD-10-CM | POA: Diagnosis not present

## 2023-03-30 DIAGNOSIS — E669 Obesity, unspecified: Secondary | ICD-10-CM | POA: Diagnosis not present

## 2023-03-30 DIAGNOSIS — Z6828 Body mass index (BMI) 28.0-28.9, adult: Secondary | ICD-10-CM | POA: Diagnosis not present

## 2023-03-30 MED ORDER — VITAMIN D (ERGOCALCIFEROL) 1.25 MG (50000 UNIT) PO CAPS: 50000.0000 [IU] | ORAL_CAPSULE | ORAL | 0 refills | Status: DC

## 2023-03-30 MED ORDER — TRULICITY 4.5 MG/0.5ML ~~LOC~~ SOAJ: 4.5000 mg | SUBCUTANEOUS | 0 refills | Status: DC

## 2023-03-30 NOTE — Progress Notes (Signed)
 WEIGHT SUMMARY AND BIOMETRICS  Weight Lost Since Last Visit: 4lb  Weight Gained Since Last Visit: 0   Vitals Temp: 97.6 F (36.4 C) BP: 124/74 Pulse Rate: 85 SpO2: 96 %   Anthropometric Measurements Height: 5' 6 (1.676 m) Weight: 176 lb (79.8 kg) BMI (Calculated): 28.42 Weight at Last Visit: 180lb Weight Lost Since Last Visit: 4lb Weight Gained Since Last Visit: 0 Starting Weight: 194lb Total Weight Loss (lbs): 18 lb (8.165 kg)   Body Composition  Body Fat %: 42.9 % Fat Mass (lbs): 75.6 lbs Muscle Mass (lbs): 95.6 lbs Total Body Water (lbs): 66.2 lbs Visceral Fat Rating : 11   Other Clinical Data Fasting: no Labs: no Today's Visit #: 9 Starting Date: 09/07/22    OBESITY Laura Kaufman is here to discuss her progress with her obesity treatment plan along with follow-up of her obesity related diagnoses. She is eating breakfast now.    Nutrition Plan: the Category 3 plan - 50% adherence.  Current exercise: none  Interim History:  She is down another 4 lbs since her last visit.  Eating all of the food on the plan., Protein intake is as prescribed, Is not skipping meals, and Water intake is adequate.   Pharmacotherapy: Laura Kaufman is on Trulicity  3.0 mg SQ weekly Adverse side effects: None Hunger is moderately controlled.  Cravings are moderately controlled.  Assessment/Plan:   Prediabetes Last A1c was 5.9  Medication(s): Trulicity  3.0 mg SQ weekly Lab Results  Component Value Date   HGBA1C 5.9 (H) 09/07/2022   HGBA1C 5.6 03/03/2015   Lab Results  Component Value Date   INSULIN  27.1 (H) 09/07/2022    Plan: Will minimize all refined carbohydrates both sweets and starches.  Will work on the plan and exercise.  Consider both aerobic and resistance training.  Will keep protein, water, and fiber intake high.  Increase Polyunsaturated and  Monounsaturated fats to increase satiety and encourage weight loss.  Aim for 7 to 9 hours of sleep nightly.  Continue and increase dose Trulicity  4.5 mg SQ weekly   Vitamin D  Deficiency Vitamin D  is at goal of 50.  Most recent vitamin D  level was 12.7. She is on  prescription ergocalciferol  50,000 IU weekly. Lab Results  Component Value Date   VD25OH 12.7 (L) 08/26/2022    Plan: Refill prescription vitamin D  50,000 IU weekly.    Generalized Obesity: Current BMI BMI (Calculated): 28.42   Pharmacotherapy Plan Continue and increase dose  Trulicity  4.5 mg SQ weekly  Laura Kaufman is currently in the action stage of change. As such, her goal is to continue with weight loss efforts.  She has agreed to the Category 3 plan.  Exercise goals: Older adults should determine their level of effort for physical activity relative to their level of fitness.   Behavioral modification strategies: increasing lean protein intake, decreasing simple carbohydrates , no meal skipping,  meal planning , increase water intake, planning for success, emotional eating strategies, ways to avoid boredom eating, keep healthy foods in the home, weigh protein portions, and mindful eating.  Laura Kaufman has agreed to follow-up with our clinic in 4 weeks.      Objective:   VITALS: Per patient if applicable, see vitals. GENERAL: Alert and in no acute distress. CARDIOPULMONARY: No increased WOB. Speaking in clear sentences.  PSYCH: Pleasant and cooperative. Speech normal rate and rhythm. Affect is appropriate. Insight and judgement are appropriate. Attention is focused, linear, and appropriate.  NEURO: Oriented as arrived to appointment on time with no prompting.   Attestation Statements:    This was prepared with the assistance of Engineer, Civil (consulting).  Occasional wrong-word or sound-a-like substitutions may have occurred due to the inherent limitations of voice recognition   Clayborne Daring, DO

## 2023-03-30 NOTE — Telephone Encounter (Signed)
 Patient stated her and Dr.Brown had a conversation about her medication (Trulicity ) and the insurance company will not tell her the price amount for a 3 month supply. Patient stated to please only send in 1 month supply. Thanks! If you have any questions, please contact patient. Send medication to her regular pharmacy [facility] on [address]

## 2023-04-05 ENCOUNTER — Ambulatory Visit: Payer: Medicare HMO | Admitting: Student

## 2023-04-05 VITALS — BP 122/74 | HR 99 | Ht 66.0 in | Wt 178.6 lb

## 2023-04-05 DIAGNOSIS — L989 Disorder of the skin and subcutaneous tissue, unspecified: Secondary | ICD-10-CM | POA: Diagnosis not present

## 2023-04-05 DIAGNOSIS — I10 Essential (primary) hypertension: Secondary | ICD-10-CM | POA: Diagnosis not present

## 2023-04-05 NOTE — Assessment & Plan Note (Addendum)
 Picture taken in chart, appears to be c/w lipoma verses cyst. Will send referral to internal derm clinic for evaluation and removal.

## 2023-04-05 NOTE — Patient Instructions (Signed)
 It was great to see you today!   Future Appointments  Date Time Provider Department Center  05/03/2023 12:40 PM Kirk Peper A, DO [facility] None    Please arrive 15 minutes before your appointment to ensure smooth check in process.    Please call the clinic at [phone] if your symptoms worsen or you have any concerns.  Thank you for allowing me to participate in your care, Dr. Clem Currier [facility]

## 2023-04-05 NOTE — Progress Notes (Signed)
    SUBJECTIVE:   CHIEF COMPLAINT / HPI:   Laura Kaufman is a 68 y.o. female  presenting for yearly check up of chronic conditions and concern for a skin lesion that has increased in size.   She is doing very well with weight loss and has currently lost 42 lbs. She denies side effects to the Trulicity .   Her blood pressure has been well controlled with losartan  25 mg daily. She denies SE to medication.   Skin lesion has been present for many years and she was previously told that it was a lipoma. She reports that it is not bothering her due to its location and increasing in sizer. She is requesting removal.   PERTINENT  PMH / PSH: Reviewed and updated   OBJECTIVE:   BP 122/74   Pulse 99   Ht 5\' 6"  (1.676 m)   Wt 178 lb 9.6 oz (81 kg)   SpO2 97%   BMI 28.83 kg/m   Well-appearing, no acute distress Cardio: Regular rate, regular rhythm, no murmurs on exam. Pulm: Clear, no wheezing, no crackles. No increased work of breathing Abdominal: bowel sounds present, soft, non-tender, non-distended Extremities: no peripheral edema  Neuro: alert and oriented x3, speech normal in content, no facial asymmetry, strength intact and equal bilaterally in UE and LE, pupils equal and reactive to light.  Psych:  Cognition and judgment appear intact. Alert, communicative  and cooperative with normal attention span and concentration. No apparent delusions, illusions, hallucinations        04/05/2023   11:55 AM 08/26/2022    1:40 PM 08/22/2022   11:59 AM  PHQ9 SCORE ONLY  PHQ-9 Total Score 4 7 6       ASSESSMENT/PLAN:   HTN (hypertension) Well controlled, continue losartan  25 mg.   Skin lesion of chest wall Picture taken in chart, appears to be c/w lipoma verses cyst. Will send referral to internal derm clinic for evaluation and removal.      Clem Currier, DO [facility]

## 2023-04-05 NOTE — Assessment & Plan Note (Signed)
Well-controlled, continue losartan 25 mg

## 2023-04-27 ENCOUNTER — Ambulatory Visit: Payer: Medicare HMO | Admitting: Family Medicine

## 2023-04-27 VITALS — BP 125/80 | HR 102 | Temp 98.1°F | Wt 177.6 lb

## 2023-04-27 DIAGNOSIS — L729 Follicular cyst of the skin and subcutaneous tissue, unspecified: Secondary | ICD-10-CM | POA: Diagnosis not present

## 2023-04-27 NOTE — Patient Instructions (Signed)
Sutured Wound Care Sutures are stitches that can be used to close wounds. Some stitches break down as they heal (absorbable). Other stitches need to be taken out by your doctor (nonabsorbable). Taking good care of your wound can help to prevent pain and infection. It can also help your wound heal more quickly. Follow instructions from your doctor about how to care for your sutured wound. Supplies needed: Soap and water. A clean, dry towel. Solution to clean your wound, if needed. A clean gauze or bandage (dressing), if needed. Antibiotic ointment, if told by your doctor. How to care for your sutured wound  Keep the wound fully dry for the first 24 hours or as long as told by your doctor. After 24-48 hours, you may shower or bathe as told by your doctor. Do not soak the wound or put the wound under water until the stitches have been taken out. After the first 24 hours, clean the wound once a day, or as often as your doctor tells you to. Take these steps: Wash and rinse the wound as told by your health care provider. Pat the wound dry with a clean towel. Do not rub the wound. After cleaning the wound, put a thin layer of antibiotic ointment on the wound as told by your doctor. This will help: Prevent infection. Keep the bandage from sticking to the wound. Follow instructions from your doctor about how to change your bandage. Make sure you: Wash your hands with soap and water for at least 20 seconds. If you cannot use soap and water, use hand sanitizer. Change your bandage at least once a day, or as often as told by your doctor. If your dressing gets wet or dirty, change it. Leavestitches in place for at least 2 weeks. If you have skin glue over your stitches, this should also stay in place for at least 2 weeks. Leave tape strips alone (if you have them) unless you are told to take them off. You may trim the edges of the tape strips if they curl up. Check your wound every day for signs of  infection. Watch for: Redness, swelling, or pain. Fluid or blood. New warmth, a rash, or hardness at the wound site. Pus or a bad smell. Have the stitches taken out as told by your doctor. Follow these instructions at home: Medicines Take or apply over-the-counter and prescription medicines only as told by your doctor. If you were prescribed an antibiotic medicine or ointment, take or apply it as told by your doctor. Do not stop using the antibiotic even if you start to feel better. General instructions Cover your wound with clothes or put sunscreen on when you are outside. Use a sunscreen of at least 30 SPF. Do not scratch or pick at your wound. Avoid stretching your wound. Raise the injured area above the level of your heart while you are sitting or lying down, if possible. Eat a diet that includes protein, vitamin A, and vitamin C. Doing this will help your wound heal. Drink enough fluid to keep your pee (urine) pale yellow. Keep all follow-up visits. Contact a doctor if: You were given a tetanus shot and you have any of the following at the site where the needle went in: Swelling. Very bad pain. Redness. Bleeding. Your wound breaks open. You see something coming out of your wound, such as wood or glass. You have any of these signs of infection in or around your wound: Redness, swelling, or pain. Fluid or blood.  Warmth. A new rash. Your wound feels hard. You have a fever. The skin near your wound changes color. You have pain that does not get better with medicine. You get numbness around the wound. Get help right away if: You have very bad swelling or more pain around your wound. You have pus or a bad smell coming from your wound. You have painful lumps near your wound or anywhere on your body. You have a red streak going away from your wound. The wound is on your hand or foot, and: Your fingers or toes look pale or blue. You cannot move a finger or toe as you used to  do. You have numbness that spreads down your hand, foot, fingers, or toes. Summary Sutures are stitches that are used to close wounds. Taking good care of your wound can help to prevent pain and infection. Keep the wound fully dry for the first 24 hours or for as long as told by your doctor. After 24-48 hours, you may shower or bathe as told by your doctor. This information is not intended to replace advice given to you by your health care provider. Make sure you discuss any questions you have with your health care provider. Document Revised: 07/13/2020 Document Reviewed: 07/13/2020 Elsevier Patient Education  2024 [employer].

## 2023-04-27 NOTE — Progress Notes (Signed)
    SUBJECTIVE:   CHIEF COMPLAINT / HPI: possible lipoma vs cyst removal  Patient presents today for cyst for lipoma removal. States that it has become a third breast. She would like it removed. No new concerns since visit with Dr. Cleotilde.  PERTINENT  PMH / PSH: Chest mass  OBJECTIVE:   BP 125/80   Pulse (!) 102   Temp 98.1 F (36.7 C)   Wt 177 lb 9.6 oz (80.6 kg)   SpO2 96%   BMI 28.67 kg/m   General: NAD, well appearing Neuro: A&O Respiratory: normal WOB on RA Extremities: Moving all 4 extremities equally Skin: 1cm subcutaneous nodule just laterally left to the sternum not part of breast tissue, no erythema, non-tender, non mobile  ASSESSMENT/PLAN:   Assessment & Plan Subcutaneous cyst Excision below diagnostic and therapeutic for simple cyst. Wound care counseling provided and follow-up scheduled.  Cyst Removal  Discussed risks/benefits of procedure, and patient gave consent for excision of cyst.  Prepped skin in usual sterile fashion.  Used 3cc of 1% lidocaine with epinephrine for local anesthesia, #15 blade to make an linear incision. Manual compression was utilized to drain ~2ccs of purulent drainage from cyst. Scissors and forceps were used to tease away cyst sac. Excised cyst sac. Used 3.0 non-absorbable suture to close wound with 1 interrupted simple sutures.  Covered wound with sterile bandage and dressing.  EBL <1cc.  Patient tolerated procedure well.  Discussed wound care and provided wound care handout, and advised f/u in 1 wk for suture removal.     Return in about 1 week (around 05/04/2023) for Suture removal.  Ozell Provencal, MD [facility]

## 2023-05-03 ENCOUNTER — Ambulatory Visit (INDEPENDENT_AMBULATORY_CARE_PROVIDER_SITE_OTHER): Payer: Medicare HMO | Admitting: Bariatrics

## 2023-05-03 ENCOUNTER — Encounter: Payer: Self-pay | Admitting: Bariatrics

## 2023-05-03 VITALS — BP 136/80 | HR 92 | Temp 98.0°F | Ht 66.0 in | Wt 175.0 lb

## 2023-05-03 DIAGNOSIS — Z6828 Body mass index (BMI) 28.0-28.9, adult: Secondary | ICD-10-CM | POA: Diagnosis not present

## 2023-05-03 DIAGNOSIS — E669 Obesity, unspecified: Secondary | ICD-10-CM

## 2023-05-03 DIAGNOSIS — R7303 Prediabetes: Secondary | ICD-10-CM | POA: Diagnosis not present

## 2023-05-03 DIAGNOSIS — E559 Vitamin D deficiency, unspecified: Secondary | ICD-10-CM

## 2023-05-03 MED ORDER — TRULICITY 4.5 MG/0.5ML ~~LOC~~ SOAJ: 4.5000 mg | SUBCUTANEOUS | 0 refills | Status: DC

## 2023-05-03 MED ORDER — VITAMIN D (ERGOCALCIFEROL) 1.25 MG (50000 UNIT) PO CAPS: 50000.0000 [IU] | ORAL_CAPSULE | ORAL | 0 refills | Status: DC

## 2023-05-03 NOTE — Progress Notes (Signed)
WEIGHT SUMMARY AND BIOMETRICS  Weight Lost Since Last Visit: 1lb  Weight Gained Since Last Visit: 0   Vitals Temp: 98 F (36.7 C) BP: 136/80 Pulse Rate: 92 SpO2: 99 %   Anthropometric Measurements Height: 5\' 6"  (1.676 m) Weight: 175 lb (79.4 kg) BMI (Calculated): 28.26 Weight at Last Visit: 176lb Weight Lost Since Last Visit: 1lb Weight Gained Since Last Visit: 0 Starting Weight: 194lb Total Weight Loss (lbs): 19 lb (8.618 kg)   Body Composition  Body Fat %: 43.1 % Fat Mass (lbs): 75.6 lbs Muscle Mass (lbs): 94.6 lbs Total Body Water (lbs): 65.8 lbs Visceral Fat Rating : 11   Other Clinical Data Fasting: no Labs: no Today's Visit #: 10 Starting Date: 09/07/22    OBESITY Laura Kaufman is here to discuss her progress with her obesity treatment plan along with follow-up of her obesity related diagnoses.    Nutrition Plan: the Category 3 plan - 75% adherence.  Current exercise: walking  Interim History:  She is down 1 lb since her last visit.  Eating all of the food on the plan., Is not skipping meals, Not journaling consistently., and Water intake is adequate.   Pharmacotherapy: Laura Kaufman is on Trulicity 4.5 mg SQ weekly Adverse side effects: None Hunger is moderately controlled.  Cravings are moderately controlled.  Assessment/Plan:   Vitamin D Deficiency Vitamin D is not at goal of 50.  Most recent vitamin D level was 12.7. She is on  prescription ergocalciferol 50,000 IU weekly. Lab Results  Component Value Date   VD25OH 12.7 (L) 08/26/2022    Plan: Refill prescription vitamin D 50,000 IU weekly.   Prediabetes Last A1c was 5.9  Medication(s): Trulicity 4.5 mg SQ weekly Lab Results  Component Value Date   HGBA1C 5.9 (H) 09/07/2022   HGBA1C 5.6 03/03/2015   Lab Results  Component Value Date   INSULIN 27.1 (H) 09/07/2022     Plan: Will minimize all refined carbohydrates both sweets and starches.  Will work on the plan and exercise.  Consider both aerobic and resistance training.  Will keep protein, water, and fiber intake high.  Increase Polyunsaturated and Monounsaturated fats to increase satiety and encourage weight loss.  She will continue to cook at home.  Continue and refill Trulicity 4.5 mg SQ weekly    Generalized Obesity: Current BMI BMI (Calculated): 28.26   Pharmacotherapy Plan Continue and refill  Trulicity 4.5 mg SQ weekly  Laura Kaufman is currently in the action stage of change. As such, her goal is to continue with weight loss efforts.  She has agreed to the pescatarian plan.  Exercise goals: Older adults should determine their level of effort for physical activity relative to their level of fitness.   Behavioral modification strategies: increasing lean protein intake, no meal skipping, planning for success, get rid of junk food in the home, keep healthy foods in the  home, weigh protein portions, and mindful eating. She will start to do some resistance with weights at home.   Laura Kaufman has agreed to follow-up with our clinic in 4 weeks.      Objective:   VITALS: Per patient if applicable, see vitals. GENERAL: Alert and in no acute distress. CARDIOPULMONARY: No increased WOB. Speaking in clear sentences.  PSYCH: Pleasant and cooperative. Speech normal rate and rhythm. Affect is appropriate. Insight and judgement are appropriate. Attention is focused, linear, and appropriate.  NEURO: Oriented as arrived to appointment on time with no prompting.   Attestation Statements:    This was prepared with the assistance of Engineer, civil (consulting).  Occasional wrong-word or sound-a-like substitutions may have occurred due to the inherent limitations of voice recognition   Corinna Capra, DO

## 2023-05-04 ENCOUNTER — Ambulatory Visit: Payer: Medicare HMO

## 2023-05-04 DIAGNOSIS — Z4802 Encounter for removal of sutures: Secondary | ICD-10-CM

## 2023-05-04 NOTE — Progress Notes (Signed)
Patient presents for suture removal. The wound is well healed without signs of infection.   The (1) suture was removed. Wound care and activity instructions given. Return prn.

## 2023-05-15 DIAGNOSIS — Z8249 Family history of ischemic heart disease and other diseases of the circulatory system: Secondary | ICD-10-CM | POA: Diagnosis not present

## 2023-05-15 DIAGNOSIS — Z008 Encounter for other general examination: Secondary | ICD-10-CM | POA: Diagnosis not present

## 2023-05-15 DIAGNOSIS — Z809 Family history of malignant neoplasm, unspecified: Secondary | ICD-10-CM | POA: Diagnosis not present

## 2023-05-15 DIAGNOSIS — M810 Age-related osteoporosis without current pathological fracture: Secondary | ICD-10-CM | POA: Diagnosis not present

## 2023-05-15 DIAGNOSIS — I129 Hypertensive chronic kidney disease with stage 1 through stage 4 chronic kidney disease, or unspecified chronic kidney disease: Secondary | ICD-10-CM | POA: Diagnosis not present

## 2023-05-15 DIAGNOSIS — Z87892 Personal history of anaphylaxis: Secondary | ICD-10-CM | POA: Diagnosis not present

## 2023-05-15 DIAGNOSIS — M1990 Unspecified osteoarthritis, unspecified site: Secondary | ICD-10-CM | POA: Diagnosis not present

## 2023-05-15 DIAGNOSIS — Z823 Family history of stroke: Secondary | ICD-10-CM | POA: Diagnosis not present

## 2023-05-15 DIAGNOSIS — N182 Chronic kidney disease, stage 2 (mild): Secondary | ICD-10-CM | POA: Diagnosis not present

## 2023-05-15 DIAGNOSIS — Z7985 Long-term (current) use of injectable non-insulin antidiabetic drugs: Secondary | ICD-10-CM | POA: Diagnosis not present

## 2023-05-15 DIAGNOSIS — E1122 Type 2 diabetes mellitus with diabetic chronic kidney disease: Secondary | ICD-10-CM | POA: Diagnosis not present

## 2023-05-15 DIAGNOSIS — K219 Gastro-esophageal reflux disease without esophagitis: Secondary | ICD-10-CM | POA: Diagnosis not present

## 2023-05-15 DIAGNOSIS — E785 Hyperlipidemia, unspecified: Secondary | ICD-10-CM | POA: Diagnosis not present

## 2023-05-31 ENCOUNTER — Ambulatory Visit (INDEPENDENT_AMBULATORY_CARE_PROVIDER_SITE_OTHER): Payer: Medicare HMO | Admitting: Bariatrics

## 2023-05-31 ENCOUNTER — Encounter: Payer: Self-pay | Admitting: Bariatrics

## 2023-05-31 VITALS — BP 117/76 | HR 90 | Temp 97.7°F | Ht 66.0 in | Wt 174.0 lb

## 2023-05-31 DIAGNOSIS — E669 Obesity, unspecified: Secondary | ICD-10-CM | POA: Diagnosis not present

## 2023-05-31 DIAGNOSIS — E559 Vitamin D deficiency, unspecified: Secondary | ICD-10-CM | POA: Diagnosis not present

## 2023-05-31 DIAGNOSIS — R7303 Prediabetes: Secondary | ICD-10-CM | POA: Diagnosis not present

## 2023-05-31 DIAGNOSIS — Z6828 Body mass index (BMI) 28.0-28.9, adult: Secondary | ICD-10-CM

## 2023-05-31 MED ORDER — VITAMIN D (ERGOCALCIFEROL) 1.25 MG (50000 UNIT) PO CAPS: 50000.0000 [IU] | ORAL_CAPSULE | ORAL | 0 refills | Status: DC

## 2023-05-31 MED ORDER — TRULICITY 4.5 MG/0.5ML ~~LOC~~ SOAJ: 4.5000 mg | SUBCUTANEOUS | 0 refills | Status: DC

## 2023-05-31 NOTE — Progress Notes (Signed)
 WEIGHT SUMMARY AND BIOMETRICS  Weight Lost Since Last Visit: 1  Weight Gained Since Last Visit: 0   Vitals Temp: 97.7 F (36.5 C) BP: 117/76 Pulse Rate: 90 SpO2: 97 %   Anthropometric Measurements Height: 5\' 6"  (1.676 m) Weight: 174 lb (78.9 kg) BMI (Calculated): 28.1 Weight at Last Visit: 175 lb Weight Lost Since Last Visit: 1 Weight Gained Since Last Visit: 0 Starting Weight: 194 lb Total Weight Loss (lbs): 20 lb (9.072 kg)   Body Composition  Body Fat %: 42.3 % Fat Mass (lbs): 73.6 lbs Muscle Mass (lbs): 95.4 lbs Total Body Water (lbs): 66.6 lbs Visceral Fat Rating : 11   Other Clinical Data Today's Visit #: 11 Starting Date: 09/07/22    OBESITY Laura Kaufman is here to discuss her progress with her obesity treatment plan along with follow-up of her obesity related diagnoses.    Nutrition Plan: the pescatarian plan - 75% adherence.  Current exercise: walking  Interim History:  She is down 1 lb since her last visit.  Eating all of the food on the plan., Protein intake is as prescribed, Water intake is adequate., and Denies polyphagia   Pharmacotherapy: Laura Kaufman is on Trulicity 4.5 mg SQ weekly Adverse side effects: None Hunger is moderately controlled.  Cravings are moderately controlled.  Assessment/Plan:   Prediabetes Last A1c was 5.9  Medication(s): Trulicity Trulicity 4.5 mg SQ weekly Lab Results  Component Value Date   HGBA1C 5.9 (H) 09/07/2022   HGBA1C 5.6 03/03/2015   Lab Results  Component Value Date   INSULIN 27.1 (H) 09/07/2022    Plan: She will work on techniques to lower her stress levels.  Will minimize all refined carbohydrates both sweets and starches.  Will work on the plan and exercise.  Consider both aerobic and resistance training.  Will keep protein, water, and fiber intake high.  Increase Polyunsaturated and  Monounsaturated fats to increase satiety and encourage weight loss.  Aim for 7 to 9 hours of sleep nightly.  Continue and refill Trulicity 4.5 mg SQ weekly   Vitamin D Deficiency Vitamin D is not at goal of 50.  Most recent vitamin D level was 12.7. She is on  prescription ergocalciferol 50,000 IU weekly. Lab Results  Component Value Date   VD25OH 12.7 (L) 08/26/2022    Plan: Refill prescription vitamin D 50,000 IU weekly.    Generalized Obesity: Current BMI BMI (Calculated): 28.1   Pharmacotherapy Plan Continue and refill  Trulicity 4.5 mg SQ weekly  Laura Kaufman is currently in the action stage of change. As such, her goal is to continue with weight loss efforts.  She has agreed to the Category 1 plan and the pescatarian plan.  Exercise goals: Older adults should determine their level of effort for physical activity relative to their level of fitness.   Behavioral modification strategies: increasing lean protein intake, no meal skipping,  meal planning , increase water intake, better snacking choices, planning for success, avoiding temptations, and mindful eating.  Laura Kaufman has agreed to follow-up with our clinic in 4 weeks.      Objective:   VITALS: Per patient if applicable, see vitals. GENERAL: Alert and in no acute distress. CARDIOPULMONARY: No increased WOB. Speaking in clear sentences.  PSYCH: Pleasant and cooperative. Speech normal rate and rhythm. Affect is appropriate. Insight and judgement are appropriate. Attention is focused, linear, and appropriate.  NEURO: Oriented as arrived to appointment on time with no prompting.   Attestation Statements:   This was prepared with the assistance of Engineer, civil (consulting).  Occasional wrong-word or sound-a-like substitutions may have occurred due to the inherent limitations of voice recognition   Corinna Capra, DO

## 2023-06-28 ENCOUNTER — Ambulatory Visit (INDEPENDENT_AMBULATORY_CARE_PROVIDER_SITE_OTHER): Admitting: Bariatrics

## 2023-06-28 ENCOUNTER — Encounter: Payer: Self-pay | Admitting: Bariatrics

## 2023-06-28 VITALS — BP 131/85 | HR 81 | Temp 98.3°F | Ht 66.0 in | Wt 176.0 lb

## 2023-06-28 DIAGNOSIS — Z6828 Body mass index (BMI) 28.0-28.9, adult: Secondary | ICD-10-CM | POA: Diagnosis not present

## 2023-06-28 DIAGNOSIS — E669 Obesity, unspecified: Secondary | ICD-10-CM | POA: Diagnosis not present

## 2023-06-28 DIAGNOSIS — R7303 Prediabetes: Secondary | ICD-10-CM

## 2023-06-28 DIAGNOSIS — E559 Vitamin D deficiency, unspecified: Secondary | ICD-10-CM | POA: Diagnosis not present

## 2023-06-28 MED ORDER — VITAMIN D (ERGOCALCIFEROL) 1.25 MG (50000 UNIT) PO CAPS: 50000.0000 [IU] | ORAL_CAPSULE | ORAL | 0 refills | Status: DC

## 2023-06-28 MED ORDER — TRULICITY 4.5 MG/0.5ML ~~LOC~~ SOAJ: 4.5000 mg | SUBCUTANEOUS | 0 refills | Status: DC

## 2023-06-28 NOTE — Progress Notes (Signed)
 WEIGHT SUMMARY AND BIOMETRICS  Weight Lost Since Last Visit: 0  Weight Gained Since Last Visit: 2lb   Vitals Temp: 98.3 F (36.8 C) BP: 131/85 Pulse Rate: 81 SpO2: 99 %   Anthropometric Measurements Height: 5\' 6"  (1.676 m) Weight: 176 lb (79.8 kg) BMI (Calculated): 28.42 Weight at Last Visit: 174lb Weight Lost Since Last Visit: 0 Weight Gained Since Last Visit: 2lb Starting Weight: 194lb Total Weight Loss (lbs): 18 lb (8.165 kg)   Body Composition  Body Fat %: 42.9 % Fat Mass (lbs): 75.8 lbs Muscle Mass (lbs): 95.8 lbs Total Body Water (lbs): 66.6 lbs Visceral Fat Rating : 11   Other Clinical Data Fasting: no Labs: no Today's Visit #: 12 Starting Date: 09/07/22    OBESITY Laura Kaufman is here to discuss her progress with her obesity treatment plan along with follow-up of her obesity related diagnoses.    Nutrition Plan: the pescatarian plan - 65% adherence.  Current exercise: no regular exercise  Interim History:  She is up 2 lbs since her last visit.  Eating all of the food on the plan., Is not skipping meals, and Denies polyphagia   Pharmacotherapy: Laura Kaufman is on Trulicity 4.5 mg SQ weekly Adverse side effects: None Hunger is moderately controlled.  Cravings are moderately controlled.  Assessment/Plan:   Vitamin D Deficiency Vitamin D is not at goal of 50.  Most recent vitamin D level was 12.7. She is on  prescription ergocalciferol 50,000 IU weekly. Lab Results  Component Value Date   VD25OH 12.7 (L) 08/26/2022    Plan: Refill prescription vitamin D 50,000 IU weekly.   Prediabetes Last A1c was 5.9  Medication(s): Trulicity 4.5 mg SQ weekly Lab Results  Component Value Date   HGBA1C 5.9 (H) 09/07/2022   HGBA1C 5.6 03/03/2015   Lab Results  Component Value Date   INSULIN 27.1 (H) 09/07/2022    Plan: Will minimize all  refined carbohydrates both sweets and starches.  Will work on the plan and exercise.  Consider both aerobic and resistance training.  Will keep protein, water, and fiber intake high.  Aim for 7 to 9 hours of sleep nightly.  Continue and refill Trulicity 4.5 mg SQ weekly  Will continue to prepare meals at home.  Will increase her water to at least 80 cc daily.     Generalized Obesity: Current BMI BMI (Calculated): 28.42   Pharmacotherapy Plan Continue and refill  Trulicity 4.5 mg SQ weekly  Laura Kaufman is currently in the action stage of change. As such, her goal is to continue with weight loss efforts.  She has agreed to the pescatarian plan.  Exercise goals: All adults should avoid inactivity. Some physical activity is better than none, and adults who participate in any amount of physical activity gain some health benefits.  Behavioral modification strategies: increasing lean protein intake, no meal skipping, meal planning ,  increase water intake, better snacking choices, planning for success, increasing vegetables, avoiding temptations, keep healthy foods in the home, and weigh protein portions.  Laura Kaufman has agreed to follow-up with our clinic in 4 weeks.      Objective:   VITALS: Per patient if applicable, see vitals. GENERAL: Alert and in no acute distress. CARDIOPULMONARY: No increased WOB. Speaking in clear sentences.  PSYCH: Pleasant and cooperative. Speech normal rate and rhythm. Affect is appropriate. Insight and judgement are appropriate. Attention is focused, linear, and appropriate.  NEURO: Oriented as arrived to appointment on time with no prompting.   Attestation Statements:    This was prepared with the assistance of Engineer, civil (consulting).  Occasional wrong-word or sound-a-like substitutions may have occurred due to the inherent limitations of voice recognition   Corinna Capra, DO

## 2023-07-03 NOTE — Progress Notes (Unsigned)
    SUBJECTIVE:   CHIEF COMPLAINT / HPI: UTI sx  UTI Patient reports that she has been having dysuria for about a week and the pain has worsened. Patient endorses drinking 2 cups of coffee and 3 pepsi a day, but doesn't drink water.   BP Patient would like to see if she can discontinue her blood pressure medication losartan today since her blood pressure has been stable and she has lost weight.  Denies any symptoms like dizziness, lightheadedness, fainting, or headaches.  PERTINENT  PMH / PSH: UTI, postmenopausal  OBJECTIVE:   BP 108/66   Pulse (!) 55   Wt 162 lb (73.5 kg)   SpO2 95%   BMI 26.15 kg/m   General: Awake and Alert in NAD HEENT: NCAT. Sclera anicteric. No rhinorrhea. Cardiovascular: RRR. No M/R/G Respiratory: CTAB, normal WOB on RA. No wheezing, crackles, rhonchi, or diminished breath sounds. Abdomen: Soft, non-tender, non-distended. Bowel sounds normoactive Extremities: Able to move all extremities. No BLE edema, no deformities or significant joint findings. Skin: Warm and dry. No abrasions or rashes noted. Neuro: No focal neurological deficits.  ASSESSMENT/PLAN:   Assessment & Plan UTI symptoms Likely secondary to genitourinary syndrome of postmenopausal women due to this being the second UTI she has had in a matter of 6 months.  Previously had a UA which was normal, but urine culture revealed E. coli and was treated with cefadroxil 500 mg BID for 5 days. - UA and urine culture ordered today - Cefadroxil 500 mg BID for 5 days prescribed, will adjust as needed per urine culture - Could consider vaginal estrogen therapy if her symptoms persist since vaginal estrogen could reduce the frequency of UTIs and alleviate GSM symptoms.  Primary hypertension Patient was on losartan 25 mg daily for her elevated BP.  Will discontinue at this time due to improvement in her blood pressure likely due to weight loss. - Advised patient to monitor her BP at home to ensure that it  is not elevated - Follow-up in 1 month with PCP to ensure BP isn't elevated and Losartan doesn't need to be restarted  - Discussed decreasing her caffeine intake and drinking more water as well   Clyda Dark, DO [facility]

## 2023-07-04 ENCOUNTER — Ambulatory Visit (INDEPENDENT_AMBULATORY_CARE_PROVIDER_SITE_OTHER): Admitting: Family Medicine

## 2023-07-04 ENCOUNTER — Encounter: Payer: Self-pay | Admitting: Family Medicine

## 2023-07-04 VITALS — BP 108/66 | HR 55 | Wt 176.0 lb

## 2023-07-04 DIAGNOSIS — I10 Essential (primary) hypertension: Secondary | ICD-10-CM

## 2023-07-04 DIAGNOSIS — R399 Unspecified symptoms and signs involving the genitourinary system: Secondary | ICD-10-CM | POA: Diagnosis not present

## 2023-07-04 LAB — POCT URINALYSIS DIP (MANUAL ENTRY)
Bilirubin, UA: NEGATIVE
Glucose, UA: NEGATIVE mg/dL
Ketones, POC UA: NEGATIVE mg/dL
Leukocytes, UA: NEGATIVE
Nitrite, UA: NEGATIVE
Protein Ur, POC: NEGATIVE mg/dL
Spec Grav, UA: 1.01 (ref 1.010–1.025)
Urobilinogen, UA: 0.2 U/dL
pH, UA: 5.5 (ref 5.0–8.0)

## 2023-07-04 MED ORDER — CEFADROXIL 500 MG PO CAPS: 500.0000 mg | ORAL_CAPSULE | Freq: Two times a day (BID) | ORAL | 0 refills | Status: AC

## 2023-07-04 NOTE — Patient Instructions (Addendum)
 It was great to see you today! Thank you for choosing [facility] for your primary care. Laura Kaufman was seen for UTI symptoms.  Today we addressed: UTI symptoms - We sent an antibiotic to your pharmacy. We will let you know if we have to change your antibiotic based on your culture results.  - Avoid excess caffeine, alcohol, artificial sweeteners as these can irritate your bladder. 2.   BP - Your BP looks great today, we will discontinue your Losartan since your pressure seems to be better controlled with your weight loss.  -  You can follow up in one month with some blood pressure readings, if you notice your pressure is high at home to determine if we should restart your Losartan.   We are checking some labs today. I will send you a MyChart message with your results, per your preference. If you do not hear about your labs in the next 2 weeks, please call the office.  You should return to our clinic Return in 1 month (on 08/03/2023), or if symptoms worsen or fail to improve or if BP elevated. Please arrive 15 minutes before your appointment to ensure smooth check in process.  We appreciate your efforts in making this happen.  Thank you for allowing me to participate in your care, Clyda Dark, DO 07/04/2023, 3:21 PM PGY-1, [facility]

## 2023-07-04 NOTE — Assessment & Plan Note (Signed)
 Patient was on losartan 25 mg daily for her elevated BP.  Will discontinue at this time due to improvement in her blood pressure likely due to weight loss. - Advised patient to monitor her BP at home to ensure that it is not elevated - Follow-up in 1 month with PCP to ensure BP isn't elevated and Losartan doesn't need to be restarted  - Discussed decreasing her caffeine intake and drinking more water as well

## 2023-07-08 LAB — URINE CULTURE

## 2023-07-09 ENCOUNTER — Encounter: Payer: Self-pay | Admitting: Student

## 2023-07-10 ENCOUNTER — Other Ambulatory Visit: Payer: Self-pay | Admitting: Family Medicine

## 2023-07-10 DIAGNOSIS — R399 Unspecified symptoms and signs involving the genitourinary system: Secondary | ICD-10-CM

## 2023-07-11 ENCOUNTER — Telehealth: Payer: Self-pay

## 2023-07-11 NOTE — Telephone Encounter (Signed)
 Attempted to call patient to schedule a LAB appointment for the following orders: UA and Urine Culture per Dr. Randeen Busman request.  Patient didn't answer, left a generic VM for patient to call office back.  If or when patient calls back please schedule a lab appointment with clinic per Dr. Randeen Busman request.  Christ Courier, CMA

## 2023-07-12 ENCOUNTER — Ambulatory Visit: Admitting: Student

## 2023-07-12 VITALS — BP 136/88 | HR 90 | Temp 97.3°F | Ht 66.0 in | Wt 177.8 lb

## 2023-07-12 DIAGNOSIS — N898 Other specified noninflammatory disorders of vagina: Secondary | ICD-10-CM

## 2023-07-12 DIAGNOSIS — R300 Dysuria: Secondary | ICD-10-CM | POA: Insufficient documentation

## 2023-07-12 LAB — POCT URINALYSIS DIP (MANUAL ENTRY)
Bilirubin, UA: NEGATIVE
Glucose, UA: NEGATIVE mg/dL
Ketones, POC UA: NEGATIVE mg/dL
Leukocytes, UA: NEGATIVE
Nitrite, UA: NEGATIVE
Protein Ur, POC: NEGATIVE mg/dL
Spec Grav, UA: 1.025 (ref 1.010–1.025)
Urobilinogen, UA: 0.2 U/dL
pH, UA: 5 (ref 5.0–8.0)

## 2023-07-12 LAB — POCT UA - MICROSCOPIC ONLY
Epithelial cells, urine per micros: >20
WBC, Ur, HPF, POC: NONE SEEN (ref 0–5)

## 2023-07-12 NOTE — Progress Notes (Signed)
    SUBJECTIVE:   CHIEF COMPLAINT / HPI:   The patient, with a recent history of a UTI, presents with ongoing symptoms despite completing a course of cefidroxil. The patient was initially seen on the fifteenth of the month, at which point she had already been experiencing symptoms for a week.  Urine culture was positive for Raoutella planticola and should have been sensitive to cefadroxil .  However, the patient reports that while the symptoms improved, she never fully resolved and have since worsened.  The patient describes a persistent sensation of a UTI and burning during urination. The patient reports increased fluid intake leading to more frequent urination. The patient also reports chronic back pain, unrelated to the UTI, and denies any new back pain or fevers.  Denies any vaginal bleeding or blood in urine. The patient also reports vaginal dryness with irritation.   PERTINENT  PMH / PSH: Previous UTI  OBJECTIVE:   BP 136/88   Pulse 90   Temp (!) 97.3 F (36.3 C)   Ht 5\' 6"  (1.676 m)   Wt 177 lb 12.8 oz (80.6 kg)   SpO2 98%   BMI 28.70 kg/m    General: NAD, pleasant, well appearing  Cardiac: RRR, no murmurs. Respiratory: CTAB, normal effort, No wheezes, rales or rhonchi Abdomen: non tender including suprapubic area, nondistended, soft Skin: warm and dry Neuro: alert, no obvious focal deficits Psych: Normal affect and mood  ASSESSMENT/PLAN:   Dysuria Persistent dysuria post-cefadroxil  used to treat UTI with culture positive for Raoutella planticola. Symptoms improved but not resolved. Differential includes cystitis. Awaiting culture results. UA showing trace blood, microscopic showing only 0-2 RBCs and calcium  oxalate crystals.  Low concern for kidney stone at this time given lack of symptoms that we discussed potential for kidney stone in the future given calcium  oxalate crystals. - Advise increased water intake. - Consider cystitis treatment if culture negative for UTI. -  Await culture results before further treatment.  Vaginal dryness Likely related to postmenopausal state.  Patient advised she can schedule appointment with PCP for exam and to discuss risks and benefits of initiating topical estrogen cream if indicated and desired     Dr. Glenn Lange, DO [facility]

## 2023-07-12 NOTE — Patient Instructions (Signed)
 It was great to see you! Thank you for allowing me to participate in your care!  Our plans for today:  - Your symptoms could be due to cystitis (bladder inflammation) and not infection so we will wait for the urine culture to return before treating with antibiotics - If you are interested in started estrogen cream for vaginal dryness, I recommend scheduling an appointment with your PCP to discuss risks and benefits   We are checking some labs today, I will call you if they are abnormal will send you a MyChart message or a letter if they are normal.  If you do not hear about your labs in the next 2 weeks please let us  know.  Take care and seek immediate care sooner if you develop any concerns.   Dr. Glenn Lange, DO [facility]

## 2023-07-12 NOTE — Assessment & Plan Note (Addendum)
 Likely related to postmenopausal state.  Patient advised she can schedule appointment with PCP for exam and to discuss risks and benefits of initiating topical estrogen cream if indicated and desired

## 2023-07-12 NOTE — Assessment & Plan Note (Addendum)
 Persistent dysuria post-cefadroxil  used to treat UTI with culture positive for Raoutella planticola. Symptoms improved but not resolved. Differential includes cystitis. Awaiting culture results. UA showing trace blood, microscopic showing only 0-2 RBCs and calcium  oxalate crystals.  Low concern for kidney stone at this time given lack of symptoms that we discussed potential for kidney stone in the future given calcium  oxalate crystals. - Advise increased water intake. - Consider cystitis treatment if culture negative for UTI. - Await culture results before further treatment.

## 2023-07-16 LAB — URINE CULTURE

## 2023-07-17 ENCOUNTER — Other Ambulatory Visit

## 2023-07-17 ENCOUNTER — Telehealth: Payer: Self-pay | Admitting: Bariatrics

## 2023-07-17 DIAGNOSIS — R399 Unspecified symptoms and signs involving the genitourinary system: Secondary | ICD-10-CM | POA: Diagnosis not present

## 2023-07-17 NOTE — Telephone Encounter (Signed)
 Spoke with patient, she states that she stopped Trulicity  due to having a UTI and kidney stone. Patient states that she is thinking about canceling her upcoming appointment with you. She is asking if you would call her.

## 2023-07-17 NOTE — Telephone Encounter (Signed)
LMVM for patient to return call. 

## 2023-07-17 NOTE — Telephone Encounter (Signed)
 Patient called and stated she is off or Trulicity  due to having kidney stone. She stated she would like to speak to Dr.Brown

## 2023-07-17 NOTE — Telephone Encounter (Signed)
 Patient returns call to nurse line regarding plan.   Patient had repeat UA and culture on 07/12/23.  Does patient need additional urine testing.   Please advise.   Elsie Halo, RN

## 2023-07-18 ENCOUNTER — Encounter: Payer: Self-pay | Admitting: Family Medicine

## 2023-07-18 ENCOUNTER — Telehealth: Payer: Self-pay | Admitting: Student

## 2023-07-18 DIAGNOSIS — N3090 Cystitis, unspecified without hematuria: Secondary | ICD-10-CM

## 2023-07-18 LAB — URINALYSIS, ROUTINE W REFLEX MICROSCOPIC
Bilirubin, UA: NEGATIVE
Glucose, UA: NEGATIVE
Ketones, UA: NEGATIVE
Leukocytes,UA: NEGATIVE
Nitrite, UA: NEGATIVE
Protein,UA: NEGATIVE
RBC, UA: NEGATIVE
Specific Gravity, UA: 1.01 (ref 1.005–1.030)
Urobilinogen, Ur: 0.2 mg/dL (ref 0.2–1.0)
pH, UA: 5.5 (ref 5.0–7.5)

## 2023-07-18 NOTE — Telephone Encounter (Signed)
 Urine culture ruled out URI as cause of patient's symptoms which include burning during urination and in general feeling of lower abdominal discomfort which is hard for her to explain.  UA showed calcium  oxalate crystals.  Will refer patient to urology to be evaluated for interstitial cystitis and she may benefit from cystoscopy.  Advise she can try OTC Azo for a couple days for relief.

## 2023-07-19 ENCOUNTER — Encounter: Payer: Self-pay | Admitting: Family Medicine

## 2023-07-19 LAB — URINE CULTURE

## 2023-07-26 ENCOUNTER — Ambulatory Visit: Admitting: Bariatrics

## 2023-08-30 ENCOUNTER — Encounter: Payer: Self-pay | Admitting: Genetic Counselor

## 2023-08-30 DIAGNOSIS — Z1379 Encounter for other screening for genetic and chromosomal anomalies: Secondary | ICD-10-CM | POA: Insufficient documentation

## 2023-08-30 NOTE — Progress Notes (Signed)
 June 2025 update  The variant of unceratin signfiicance (VUS) in MLH1 has been downgraded to likely benign. Amended report date is 08/23/2023.

## 2023-09-03 ENCOUNTER — Other Ambulatory Visit: Payer: Self-pay | Admitting: Student

## 2023-09-03 DIAGNOSIS — I10 Essential (primary) hypertension: Secondary | ICD-10-CM

## 2023-09-13 ENCOUNTER — Other Ambulatory Visit: Payer: Self-pay | Admitting: Student

## 2023-09-13 DIAGNOSIS — I10 Essential (primary) hypertension: Secondary | ICD-10-CM

## 2023-10-27 ENCOUNTER — Ambulatory Visit: Admitting: Family Medicine

## 2023-10-30 ENCOUNTER — Encounter

## 2023-11-02 ENCOUNTER — Ambulatory Visit (INDEPENDENT_AMBULATORY_CARE_PROVIDER_SITE_OTHER)

## 2023-11-02 ENCOUNTER — Telehealth: Payer: Self-pay

## 2023-11-02 VITALS — BP 121/88 | HR 96 | Ht 66.0 in | Wt 178.0 lb

## 2023-11-02 DIAGNOSIS — Z0000 Encounter for general adult medical examination without abnormal findings: Secondary | ICD-10-CM | POA: Diagnosis not present

## 2023-11-02 NOTE — Telephone Encounter (Signed)
 Patient is overdue for a colonoscopy and mammogram.  Requesting referral from pcp.  Laura Maguire N. Tomie, LPN [facility] Annual Wellness Team Direct Dial: [phone]

## 2023-11-02 NOTE — Progress Notes (Signed)
 Because this visit was a virtual/telehealth visit,  certain criteria was not obtained, such a blood pressure, CBG if applicable, and timed get up and go. Any medications not marked as taking were not mentioned during the medication reconciliation part of the visit. Any vitals not documented were not able to be obtained due to this being a telehealth visit or patient was unable to self-report a recent blood pressure reading due to a lack of equipment at home via telehealth. Vitals that have been documented are verbally provided by the patient.   Subjective:   Laura Kaufman is a 68 y.o. who presents for a Medicare Wellness preventive visit.  As a reminder, Annual Wellness Visits don't include a physical exam, and some assessments may be limited, especially if this visit is performed virtually. We may recommend an in-person follow-up visit with your provider if needed.  Visit Complete: Virtual I connected with  Laura Kaufman on 11/02/23 by a audio enabled telemedicine application and verified that I am speaking with the correct person using two identifiers.  Patient Location: Home  Provider Location: Home Office  I discussed the limitations of evaluation and management by telemedicine. The patient expressed understanding and agreed to proceed.  Vital Signs: Because this visit was a virtual/telehealth visit, some criteria may be missing or patient reported. Any vitals not documented were not able to be obtained and vitals that have been documented are patient reported.  VideoDeclined- This patient declined Librarian, academic. Therefore the visit was completed with audio only.  Persons Participating in Visit: Patient.  AWV Questionnaire: Yes: Patient Medicare AWV questionnaire was completed by the patient on 11/01/2023; I have confirmed that all information answered by patient is correct and no changes since this date.  Cardiac Risk Factors include: advanced age  (>54men, >45 women);family history of premature cardiovascular disease;hypertension     Objective:    Today's Vitals   11/02/23 0835  BP: 121/88  Pulse: 96  Weight: 178 lb (80.7 kg)  Height: 5' 6 (1.676 m)  PainSc: 0-No pain   Body mass index is 28.73 kg/m.     11/02/2023    8:37 AM 07/12/2023   10:49 AM 07/04/2023    2:46 PM 04/05/2023   11:23 AM 08/26/2022    1:40 PM 08/22/2022   12:02 PM 08/01/2022   11:22 AM  Advanced Directives  Does Patient Have a Medical Advance Directive? Yes No No No No No No  Type of Estate agent of [place];Living will        Does patient want to make changes to medical advance directive? --        Copy of Healthcare Power of Attorney in Chart? No - copy requested        Would patient like information on creating a medical advance directive?   No - Patient declined No - Patient declined No - Patient declined Yes (MAU/Ambulatory/Procedural Areas - Information given) No - Patient declined    Current Medications (verified) Outpatient Encounter Medications as of 11/02/2023  Medication Sig   losartan  (COZAAR ) 25 MG tablet Take 1 tablet by mouth daily.   Turmeric (QC TUMERIC COMPLEX PO) Take by mouth.   [DISCONTINUED] Dulaglutide  (TRULICITY ) 4.5 MG/0.5ML SOAJ Inject 4.5 mg as directed once a week.   [DISCONTINUED] Vitamin D , Ergocalciferol , (DRISDOL ) 1.25 MG (50000 UNIT) CAPS capsule Take 1 capsule (50,000 Units total) by mouth every 7 (seven) days.   No facility-administered encounter medications on file as of 11/02/2023.  Allergies (verified) Bee venom and Penicillins   History: Past Medical History:  Diagnosis Date   Allergy Surgery on elbow??? 15 years ago   Penicillin   Arthritis 15 years ago   On turmeric 2x500 mg   Bipolar I disorder, most recent episode (or current) manic (HCC) 07/09/2016   Cataract    Surgery about 5 years ago   Family history of skin cancer    GERD (gastroesophageal reflux disease)    History of  colonic polyps    Hypertension    25 mg high blood pressure drug   Past Surgical History:  Procedure Laterality Date   ABDOMINAL HYSTERECTOMY     total   CATARACT EXTRACTION, BILATERAL     COLONOSCOPY  04/17/2019   ELBOW FRACTURE SURGERY Bilateral    FRACTURE SURGERY     Both elbows   JOINT REPLACEMENT     Broken Knee cap   KNEE SURGERY     WRIST SURGERY     no surgery per pt, but popped bones back into place   Family History  Problem Relation Age of Onset   Colon cancer Mother 23   Thyroid  disease Mother    Skin cancer Father        dx 36s (multiple)   Cancer Father    High blood pressure Father    Heart disease Father    Skin cancer Sister        dx 4s   Cancer Sister    Cystic fibrosis Brother    Arthritis Paternal Grandmother    Esophageal cancer Neg Hx    Stomach cancer Neg Hx    Rectal cancer Neg Hx    Colon polyps Neg Hx    Social History   Socioeconomic History   Marital status: Significant Other    Spouse name: Not on file   Number of children: Not on file   Years of education: Not on file   Highest education level: Bachelor's degree (e.g., BA, AB, BS)  Occupational History   Not on file  Tobacco Use   Smoking status: Former    Passive exposure: Past   Smokeless tobacco: Never   Tobacco comments:    quit 20 years ago  Vaping Use   Vaping status: Never Used  Substance and Sexual Activity   Alcohol use: Not Currently   Drug use: Never   Sexual activity: Not Currently    Partners: Male    Birth control/protection: Post-menopausal  Other Topics Concern   Not on file  Social History Narrative   Brother died from cystic fibrosis about 80.    Husband, Laura Kaufman recently passed away 10/27/2023.   Social Drivers of Corporate investment banker Strain: Low Risk  (11/02/2023)   Overall Financial Resource Strain (CARDIA)    Difficulty of Paying Living Expenses: Not hard at all  Food Insecurity: No Food Insecurity (11/02/2023)   Hunger Vital Sign     Worried About Running Out of Food in the Last Year: Never true    Ran Out of Food in the Last Year: Never true  Transportation Needs: No Transportation Needs (11/02/2023)   PRAPARE - Administrator, Civil Service (Medical): No    Lack of Transportation (Non-Medical): No  Physical Activity: Sufficiently Active (11/02/2023)   Exercise Vital Sign    Days of Exercise per Week: 4 days    Minutes of Exercise per Session: 130 min  Stress: Stress Concern Present (11/02/2023)   [employer]  of Occupational Health - Occupational Stress Questionnaire    Feeling of Stress: To some extent  Social Connections: Unknown (11/02/2023)   Social Connection and Isolation Panel    Frequency of Communication with Friends and Family: More than three times a week    Frequency of Social Gatherings with Friends and Family: More than three times a week    Attends Religious Services: Patient declined    Database administrator or Organizations: Patient declined    Attends Banker Meetings: Never    Marital Status: Patient declined    Tobacco Counseling Counseling given: Not Answered Tobacco comments: quit 20 years ago    Clinical Intake:  Pre-visit preparation completed: Yes  Pain : No/denies pain Pain Score: 0-No pain     BMI - recorded: 28.73 Nutritional Status: BMI 25 -29 Overweight Nutritional Risks: None Diabetes: No  Lab Results  Component Value Date   HGBA1C 5.9 (H) 09/07/2022   HGBA1C 5.6 03/03/2015     How often do you need to have someone help you when you read instructions, pamphlets, or other written materials from your doctor or pharmacy?: 1 - Never What is the last grade level you completed in school?: [employer]REE  Interpreter Needed?: No  Information entered by :: Axxel Gude N. Lashelle Koy, LPN.   Activities of Daily Living     11/02/2023    8:42 AM 11/01/2023   10:46 AM  In your present state of health, do you have any difficulty performing  the following activities:  Hearing? 0 0  Vision? 0 0  Difficulty concentrating or making decisions? 0 0  Walking or climbing stairs? 1 1  Dressing or bathing? 0 0  Doing errands, shopping? 0 0  Preparing Food and eating ? N N  Using the Toilet? N N  In the past six months, have you accidently leaked urine? N N  Do you have problems with loss of bowel control? N N  Managing your Medications? N N  Managing your Finances? N N  Housekeeping or managing your Housekeeping? N N    Patient Care Team: Cleotilde Perkins, DO as PCP - General (Family Medicine) Wonda Cy BROCKS, RD as Dietitian (Family Medicine) Cammie Batters, OD as Referring Physician (Optometry)  I have updated your Care Teams any recent Medical Services you may have received from other providers in the past year.     Assessment:   This is a routine wellness examination for Laura Kaufman.  Hearing/Vision screen Hearing Screening - Comments:: Denies hearing difficulties.  Vision Screening - Comments:: Wears reading glasses - up to date with routine eye exams with Dr. Batters Cammie. Cataracts removed.    Goals Addressed             This Visit's Progress    11/02/2023: My goal for 2025 is to stay alive and safe.         Depression Screen     11/02/2023    8:57 AM 07/04/2023    2:46 PM 04/05/2023   11:55 AM 08/26/2022    1:40 PM 08/22/2022   11:59 AM 08/01/2022   11:25 AM 12/22/2021    9:59 AM  PHQ 2/9 Scores  PHQ - 2 Score  0 0 1 0 0 0  PHQ- 9 Score  4 4 7 6 6 1   Exception Documentation Other- indicate reason in comment box        Not completed PATIENT DECLINED DUE TO DEATH OF HUSBAND IN 11/08/23.  Fall Risk     11/02/2023    8:38 AM 11/01/2023   10:46 AM 04/05/2023   11:22 AM 08/22/2022   12:00 PM 08/18/2022   10:53 AM  Fall Risk   Falls in the past year? 0 0 0 1 1  Number falls in past yr: 0 0 0 0 0  Injury with Fall? 0 0 0 0 0  Risk for fall due to : No Fall Risks  No Fall Risks History of fall(s)   Follow up  Falls evaluation completed  Falls evaluation completed Education provided;Falls prevention discussed;Falls evaluation completed     MEDICARE RISK AT HOME:  Medicare Risk at Home Any stairs in or around the home?: Yes If so, are there any without handrails?: No Home free of loose throw rugs in walkways, pet beds, electrical cords, etc?: No Adequate lighting in your home to reduce risk of falls?: Yes Life alert?: No Use of a cane, walker or w/c?: No Grab bars in the bathroom?: No Shower chair or bench in shower?: No Elevated toilet seat or a handicapped toilet?: Yes  TIMED UP AND GO:  Was the test performed?  No  Cognitive Function: Declined/Normal: No cognitive concerns noted by patient or family. Patient alert, oriented, able to answer questions appropriately and recall recent events. No signs of memory loss or confusion.    11/02/2023    8:48 AM  MMSE - Mini Mental State Exam  Not completed: Unable to complete        11/02/2023    8:37 AM 08/22/2022   12:02 PM  6CIT Screen  What Year? 0 points 0 points  What month? 0 points 0 points  What time? 0 points 0 points  Count back from 20 0 points 0 points  Months in reverse 0 points 0 points  Repeat phrase 0 points 0 points  Total Score 0 points 0 points    Immunizations Immunization History  Administered Date(s) Administered   Fluad Quad(high Dose 65+) 01/31/2020   Influenza,inj,Quad PF,6+ Mos 04/24/2019   Influenza-Unspecified 02/22/2022   PFIZER(Purple Top)SARS-COV-2 Vaccination 05/12/2019, 06/05/2019, 01/18/2020   Pfizer Covid-19 Vaccine Bivalent Booster 76yrs & up 01/21/2021   Pneumococcal Conjugate-13 04/24/2019   Tdap 03/03/2015   Zoster Recombinant(Shingrix ) 11/10/2020, 01/11/2021    Screening Tests Health Maintenance  Topic Date Due   Pneumococcal Vaccine: 50+ Years (2 of 2 - PPSV23, PCV20, or PCV21) 06/19/2019   Colonoscopy  10/07/2022   MAMMOGRAM  09/01/2023   INFLUENZA VACCINE  10/20/2023   Medicare  Annual Wellness (AWV)  11/01/2024   DTaP/Tdap/Td (2 - Td or Tdap) 03/02/2025   DEXA SCAN  Completed   Hepatitis C Screening  Completed   Zoster Vaccines- Shingrix   Completed   HPV VACCINES  Aged Out   Meningococcal B Vaccine  Aged Out   COVID-19 Vaccine  Discontinued    Health Maintenance  Health Maintenance Due  Topic Date Due   Pneumococcal Vaccine: 50+ Years (2 of 2 - PPSV23, PCV20, or PCV21) 06/19/2019   Colonoscopy  10/07/2022   MAMMOGRAM  09/01/2023   INFLUENZA VACCINE  10/20/2023   Health Maintenance Items Addressed: Yes Patient aware of current healthcare gaps.  Patient is due for Colonoscopy, Mammogram. Pneumonia and Flu vaccines.  Additional Screening:  Vision Screening: Recommended annual ophthalmology exams for early detection of glaucoma and other disorders of the eye. Would you like a referral to an eye doctor? No , patient goes to [employer], OD.  Dental Screening: Recommended annual dental  exams for proper oral hygiene  Community Resource Referral / Chronic Care Management: CRR required this visit?  No   CCM required this visit?  No   Plan:    I have personally reviewed and noted the following in the patient's chart:   Medical and social history Use of alcohol, tobacco or illicit drugs  Current medications and supplements including opioid prescriptions. Patient is not currently taking opioid prescriptions. Functional ability and status Nutritional status Physical activity Advanced directives List of other physicians Hospitalizations, surgeries, and ER visits in previous 12 months Vitals Screenings to include cognitive, depression, and falls Referrals and appointments  In addition, I have reviewed and discussed with patient certain preventive protocols, quality metrics, and best practice recommendations. A written personalized care plan for preventive services as well as general preventive health recommendations were provided to  patient.   Roz LOISE Fuller, LPN   1/85/7974   After Visit Summary: (MyChart) Due to this being a telephonic visit, the after visit summary with patients personalized plan was offered to patient via MyChart   Notes: Patient aware of current healthcare gaps.  Patient is due for Colonoscopy, Mammogram. Pneumonia and Flu vaccines.

## 2023-11-02 NOTE — Patient Instructions (Signed)
 [name] , Thank you for taking time out of your busy schedule to complete your Annual Wellness Visit with me. I enjoyed our conversation and look forward to speaking with you again next year. I, as well as your care team,  appreciate your ongoing commitment to your health goals. Please review the following plan we discussed and let me know if I can assist you in the future. Your Game plan/ To Do List    Referrals: If you haven't heard from the office you've been referred to, please reach out to them at the phone provided.   Follow up Visits: We will see or speak with you next year for your Next Medicare AWV with our clinical staff Have you seen your provider in the last 6 months (3 months if uncontrolled diabetes)? Yes  Clinician Recommendations:  Aim for 30 minutes of exercise or brisk walking, 6-8 glasses of water, and 5 servings of fruits and vegetables each day.       This is a list of the screenings recommended for you:  Health Maintenance  Topic Date Due   Pneumococcal Vaccine for age over 92 (2 of 2 - PPSV23, PCV20, or PCV21) 06/19/2019   Colon Cancer Screening  10/07/2022   Mammogram  09/01/2023   Flu Shot  10/20/2023   Medicare Annual Wellness Visit  11/01/2024   DTaP/Tdap/Td vaccine (2 - Td or Tdap) 03/02/2025   DEXA scan (bone density measurement)  Completed   Hepatitis C Screening  Completed   Zoster (Shingles) Vaccine  Completed   HPV Vaccine  Aged Out   Meningitis B Vaccine  Aged Out   COVID-19 Vaccine  Discontinued    Advanced directives: (Copy Requested) Please bring a copy of your health care power of attorney and living will to the office to be added to your chart at your convenience. You can mail to [facility] [address] [place], [place] [address] or email to [email or web address] Advance Care Planning is important because it:  [x]  Makes sure you receive the medical care that is consistent with your values, goals, and  preferences  [x]  It provides guidance to your family and loved ones and reduces their decisional burden about whether or not they are making the right decisions based on your wishes.  Follow the link provided in your after visit summary or read over the paperwork we have mailed to you to help you started getting your Advance Directives in place. If you need assistance in completing these, please reach out to us  so that we can help you!  See attachments for Preventive Care and Fall Prevention Tips.

## 2023-11-13 ENCOUNTER — Ambulatory Visit (INDEPENDENT_AMBULATORY_CARE_PROVIDER_SITE_OTHER): Admitting: Student

## 2023-11-13 ENCOUNTER — Encounter: Payer: Self-pay | Admitting: Student

## 2023-11-13 VITALS — BP 139/97 | HR 97 | Ht 66.0 in | Wt 187.8 lb

## 2023-11-13 DIAGNOSIS — F4320 Adjustment disorder, unspecified: Secondary | ICD-10-CM | POA: Diagnosis not present

## 2023-11-13 DIAGNOSIS — R350 Frequency of micturition: Secondary | ICD-10-CM

## 2023-11-13 DIAGNOSIS — R399 Unspecified symptoms and signs involving the genitourinary system: Secondary | ICD-10-CM | POA: Diagnosis not present

## 2023-11-13 DIAGNOSIS — Z1231 Encounter for screening mammogram for malignant neoplasm of breast: Secondary | ICD-10-CM | POA: Diagnosis not present

## 2023-11-13 DIAGNOSIS — L237 Allergic contact dermatitis due to plants, except food: Secondary | ICD-10-CM

## 2023-11-13 DIAGNOSIS — Z1211 Encounter for screening for malignant neoplasm of colon: Secondary | ICD-10-CM

## 2023-11-13 LAB — POCT URINALYSIS DIP (CLINITEK)
Bilirubin, UA: NEGATIVE
Glucose, UA: NEGATIVE mg/dL
Ketones, POC UA: NEGATIVE mg/dL
Leukocytes, UA: NEGATIVE
Nitrite, UA: NEGATIVE
POC PROTEIN,UA: NEGATIVE
Spec Grav, UA: 1.01 (ref 1.010–1.025)
Urobilinogen, UA: 0.2 U/dL
pH, UA: 5.5 (ref 5.0–8.0)

## 2023-11-13 LAB — POCT UA - MICROSCOPIC ONLY: WBC, Ur, HPF, POC: NONE SEEN (ref 0–5)

## 2023-11-13 MED ORDER — PREDNISONE 20 MG PO TABS: 40.0000 mg | ORAL_TABLET | Freq: Every day | ORAL | 0 refills | Status: AC

## 2023-11-13 MED ORDER — NITROFURANTOIN MONOHYD MACRO 100 MG PO CAPS: 100.0000 mg | ORAL_CAPSULE | Freq: Two times a day (BID) | ORAL | 0 refills | Status: AC

## 2023-11-13 NOTE — Assessment & Plan Note (Signed)
 Encourage patient to attend grief counseling.  She has been set up through hospice
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
## 2023-11-13 NOTE — Patient Instructions (Signed)
 It was great to see you today!   I have sent prednisone  to your pharmacy for the poison ivy  Future Appointments  Date Time Provider Department Center  11/04/2024  8:30 AM [facility] ANNUAL WELLNESS VISIT [facility]    Please arrive 15 minutes before your appointment to ensure smooth check in process.    Please call the clinic at [phone] if your symptoms worsen or you have any concerns.  Thank you for allowing me to participate in your care, Dr. Damien Pinal [facility]

## 2023-11-16 ENCOUNTER — Encounter: Payer: Self-pay | Admitting: Student

## 2023-11-23 ENCOUNTER — Encounter: Payer: Self-pay | Admitting: Gastroenterology

## 2023-11-24 DIAGNOSIS — L237 Allergic contact dermatitis due to plants, except food: Secondary | ICD-10-CM | POA: Diagnosis not present

## 2023-12-06 ENCOUNTER — Ambulatory Visit
Admission: RE | Admit: 2023-12-06 | Discharge: 2023-12-06 | Disposition: A | Discharge status: Home / Self Care | Source: Ambulatory Visit | Attending: Family Medicine | Admitting: Family Medicine

## 2023-12-06 DIAGNOSIS — Z1231 Encounter for screening mammogram for malignant neoplasm of breast: Secondary | ICD-10-CM | POA: Diagnosis not present

## 2023-12-08 ENCOUNTER — Ambulatory Visit: Payer: Self-pay | Admitting: Student

## 2023-12-08 DIAGNOSIS — G8929 Other chronic pain: Secondary | ICD-10-CM

## 2023-12-25 ENCOUNTER — Ambulatory Visit

## 2023-12-25 ENCOUNTER — Encounter: Payer: Self-pay | Admitting: Gastroenterology

## 2023-12-25 VITALS — Ht 66.0 in | Wt 185.0 lb

## 2023-12-25 DIAGNOSIS — Z860100 Personal history of colon polyps, unspecified: Secondary | ICD-10-CM

## 2023-12-25 MED ORDER — NA SULFATE-K SULFATE-MG SULF 17.5-3.13-1.6 GM/177ML PO SOLN: 1.0000 | Freq: Once | ORAL | 0 refills | Status: AC

## 2023-12-25 NOTE — Progress Notes (Signed)
Pre visit completed via phone call; Patient verified name, DOB, and address; No egg or soy allergy known to patient;  No issues known to pt with past sedation with any surgeries or procedures; Patient denies ever being told they had issues or difficulty with intubation;  No FH of Malignant Hyperthermia; Pt is not on diet pills; Pt is not on home 02;  Pt is not on blood thinners;  Pt denies issues with constipation  No A fib or A flutter; Have any cardiac testing pending--NO Insurance verified during PV appt--- [employer] Medicare Pt can ambulate without assistance;  Pt denies use of chewing tobacco; Discussed diabetic/weight loss medication holds; Discussed NSAID holds; Checked BMI to be less than 50; Pt instructed to use Singlecare.com or GoodRx for a price reduction on prep;  Patient's chart reviewed by Cathlyn Parsons CNRA prior to previsit and patient appropriate for the [facility]; Pre visit completed and red dot placed by patient's name on their procedure day (on provider's schedule);    Instructions sent to MyChart per patient request;

## 2023-12-29 NOTE — Telephone Encounter (Signed)
 Placed referral to Dr. Rubie for continued management of patient's chronic knee OA.  Damien Pinal, DO [facility], PGY-3 12/29/23 1:24 AM

## 2024-01-04 DIAGNOSIS — M25562 Pain in left knee: Secondary | ICD-10-CM | POA: Diagnosis not present

## 2024-01-08 ENCOUNTER — Encounter: Payer: Self-pay | Admitting: Gastroenterology

## 2024-01-08 ENCOUNTER — Ambulatory Visit (AMBULATORY_SURGERY_CENTER): Admitting: Gastroenterology

## 2024-01-08 VITALS — BP 128/77 | HR 80 | Temp 98.1°F | Resp 12 | Ht 66.0 in | Wt 185.0 lb

## 2024-01-08 DIAGNOSIS — K5730 Diverticulosis of large intestine without perforation or abscess without bleeding: Secondary | ICD-10-CM | POA: Diagnosis not present

## 2024-01-08 DIAGNOSIS — Z860101 Personal history of adenomatous and serrated colon polyps: Secondary | ICD-10-CM | POA: Diagnosis not present

## 2024-01-08 DIAGNOSIS — Z860100 Personal history of colon polyps, unspecified: Secondary | ICD-10-CM

## 2024-01-08 DIAGNOSIS — D175 Benign lipomatous neoplasm of intra-abdominal organs: Secondary | ICD-10-CM | POA: Diagnosis not present

## 2024-01-08 DIAGNOSIS — K635 Polyp of colon: Secondary | ICD-10-CM | POA: Diagnosis not present

## 2024-01-08 DIAGNOSIS — Z1211 Encounter for screening for malignant neoplasm of colon: Secondary | ICD-10-CM | POA: Diagnosis not present

## 2024-01-08 DIAGNOSIS — D125 Benign neoplasm of sigmoid colon: Secondary | ICD-10-CM

## 2024-01-08 MED ORDER — SODIUM CHLORIDE 0.9 % IV SOLN: 500.0000 mL | INTRAVENOUS | Status: DC

## 2024-01-08 NOTE — Progress Notes (Signed)
 Pt's states no medical or surgical changes since previsit or office visit.

## 2024-01-08 NOTE — Patient Instructions (Signed)
-   Resume previous diet. - Continue present medications. - Await pathology results. - Repeat colonoscopy in 5 years for surveillance. - Return to GI office PRN  YOU HAD AN ENDOSCOPIC PROCEDURE TODAY AT THE [facility]:   Refer to the procedure report that was given to you for any specific questions about what was found during the examination.  If the procedure report does not answer your questions, please call your gastroenterologist to clarify.  If you requested that your care partner not be given the details of your procedure findings, then the procedure report has been included in a sealed envelope for you to review at your convenience later.  YOU SHOULD EXPECT: Some feelings of bloating in the abdomen. Passage of more gas than usual.  Walking can help get rid of the air that was put into your GI tract during the procedure and reduce the bloating. If you had a lower endoscopy (such as a colonoscopy or flexible sigmoidoscopy) you may notice spotting of blood in your stool or on the toilet paper. If you underwent a bowel prep for your procedure, you may not have a normal bowel movement for a few days.  Please Note:  You might notice some irritation and congestion in your nose or some drainage.  This is from the oxygen used during your procedure.  There is no need for concern and it should clear up in a day or so.  SYMPTOMS TO REPORT IMMEDIATELY:  Following lower endoscopy (colonoscopy or flexible sigmoidoscopy):  Excessive amounts of blood in the stool  Significant tenderness or worsening of abdominal pains  Swelling of the abdomen that is new, acute  Fever of 100F or higher  For urgent or emergent issues, a gastroenterologist can be reached at any hour by calling [phone]. Do not use MyChart messaging for urgent concerns.    DIET:  We do recommend a small meal at first, but then you may proceed to your regular diet.  Drink plenty of fluids but you should avoid alcoholic  beverages for 24 hours.  ACTIVITY:  You should plan to take it easy for the rest of today and you should NOT DRIVE or use heavy machinery until tomorrow (because of the sedation medicines used during the test).    FOLLOW UP: Our staff will call the number listed on your records the next business day following your procedure.  We will call around 7:15- 8:00 am to check on you and address any questions or concerns that you may have regarding the information given to you following your procedure. If we do not reach you, we will leave a message.     If any biopsies were taken you will be contacted by phone or by letter within the next 1-3 weeks.  Please call us  at [phone] if you have not heard about the biopsies in 3 weeks.    SIGNATURES/CONFIDENTIALITY: You and/or your care partner have signed paperwork which will be entered into your electronic medical record.  These signatures attest to the fact that that the information above on your After Visit Summary has been reviewed and is understood.  Full responsibility of the confidentiality of this discharge information lies with you and/or your care-partner.

## 2024-01-08 NOTE — Op Note (Signed)
 [facility] Patient Name: Laura Kaufman Procedure Date: 01/08/2024 10:07 AM MRN: [id number] Endoscopist: Sandor Flatter , MD, [id number] Age: 68 Referring MD:  Date of Birth: 1955/08/20 Gender: Female Account #: [id number] Procedure:                Colonoscopy Indications:              High risk colon cancer surveillance: Personal                            history of sessile serrated colon polyps and                            adenomas (less than 10 mm in size)                           Last colonoscopy was 09/2020 and notable for 4                            subcentimeter sessile serrated polyp polyps, 3                            small rectal polyps (adenoma x 1, hyperplastic                            polyp x 2), ascending colon lipoma, sigmoid and                            ascending colon diverticulosis, with recommendation                            to repeat in 2 years. She was then seen in the                            genetics clinic in 10/2020 and genetic testing was                            negative.                           Prior to that, colonoscopy in 03/2019 with >10                            Tubular Adenomas and Sessile Serrated Polyps. Medicines:                Monitored Anesthesia Care Procedure:                Pre-Anesthesia Assessment:                           - Prior to the procedure, a History and Physical                            was performed, and patient medications and  allergies were reviewed. The patient's tolerance of                            previous anesthesia was also reviewed. The risks                            and benefits of the procedure and the sedation                            options and risks were discussed with the patient.                            All questions were answered, and informed consent                            was obtained. Prior Anticoagulants: The patient has                             taken no anticoagulant or antiplatelet agents. ASA                            Grade Assessment: II - A patient with mild systemic                            disease. After reviewing the risks and benefits,                            the patient was deemed in satisfactory condition to                            undergo the procedure.                           After obtaining informed consent, the colonoscope                            was passed under direct vision. Throughout the                            procedure, the patient's blood pressure, pulse, and                            oxygen saturations were monitored continuously. The                            Olympus CF-HQ190L ([id number]) Colonoscope was                            introduced through the anus and advanced to the the                            cecum, identified by appendiceal orifice and  ileocecal valve. The colonoscopy was performed                            without difficulty. The patient tolerated the                            procedure well. The quality of the bowel                            preparation was good. The ileocecal valve,                            appendiceal orifice, and rectum were photographed. Scope In: 10:49:01 AM Scope Out: 11:08:36 AM Scope Withdrawal Time: 0 hours 14 minutes 37 seconds  Total Procedure Duration: 0 hours 19 minutes 35 seconds  Findings:                 The perianal and digital rectal examinations were                            normal.                           Two sessile polyps were found in the sigmoid colon.                            The polyps were 2 to 4 mm in size. These polyps                            were removed with a cold snare. Resection and                            retrieval were complete. Estimated blood loss was                            minimal.                           There was a small lipoma, in the ascending  colon.                           A few small-mouthed diverticula were found in the                            sigmoid colon.                           The retroflexed view of the distal rectum and anal                            verge was normal and showed no anal or rectal                            abnormalities. Complications:            No  immediate complications. Estimated Blood Loss:     Estimated blood loss was minimal. Impression:               - Two 2 to 4 mm polyps in the sigmoid colon,                            removed with a cold snare. Resected and retrieved.                           - Small lipoma in the ascending colon.                           - Diverticulosis in the sigmoid colon.                           - The distal rectum and anal verge are normal on                            retroflexion view. Recommendation:           - Patient has a contact number available for                            emergencies. The signs and symptoms of potential                            delayed complications were discussed with the                            patient. Return to normal activities tomorrow.                            Written discharge instructions were provided to the                            patient.                           - Resume previous diet.                           - Continue present medications.                           - Await pathology results.                           - Repeat colonoscopy in 5 years for surveillance.                           - Return to GI office PRN. Sandor Flatter, MD 01/08/2024 11:17:35 AM

## 2024-01-08 NOTE — Progress Notes (Signed)
 Vss nad trans to pacu

## 2024-01-08 NOTE — Progress Notes (Signed)
 GASTROENTEROLOGY PROCEDURE H&P NOTE   Primary Care Physician: Cleotilde Perkins, DO    Reason for Procedure:  Colon polyp surveillance  Plan:    Colonoscopy  Patient is appropriate for endoscopic procedure(s) in the ambulatory ([facility]) setting.  The nature of the procedure, as well as the risks, benefits, and alternatives were carefully and thoroughly reviewed with the patient. Ample time for discussion and questions allowed. The patient understood, was satisfied, and agreed to proceed.     HPI: Laura Kaufman is a 68 y.o. female who presents for colonoscopy for ongoing colon polyp surveillance and colon cancer screening.  No active GI symptoms.  No known family history of colon cancer or related malignancy.  Patient is otherwise without complaints or active issues today.  Last colonoscopy was 09/2020 and notable for 4 subcentimeter sessile serrated polyp polyps, 3 small rectal polyps (adenoma x 1, hyperplastic polyp x 2), ascending colon lipoma, sigmoid and ascending colon diverticulosis, with recommendation to repeat in 2 years.  Was seen in the genetics clinic in 10/2020 and genetic testing was negative.    Colonoscopy in 03/2019 with >10 Tubular Adenomas and Sessile Serrated Polyps.   Past Medical History:  Diagnosis Date   Allergy Surgery on elbow??? 15 years ago   Penicillin   Arthritis 15 years ago   On turmeric 2x500 mg   Bipolar I disorder, most recent episode (or current) manic (HCC) 07/09/2016   Cataract    Surgery about 5 years ago   Family history of skin cancer    GERD (gastroesophageal reflux disease)    History of colonic polyps    Hypertension    25 mg high blood pressure drug    Past Surgical History:  Procedure Laterality Date   ABDOMINAL HYSTERECTOMY     total   CATARACT EXTRACTION, BILATERAL     COLONOSCOPY  04/17/2019   COLONOSCOPY  10/06/2020   VC-MAC-good-10+ TA   ELBOW FRACTURE SURGERY Bilateral    FRACTURE SURGERY     Both elbows   KNEE  SURGERY     broken knee cap   WRIST SURGERY     no surgery per pt, but popped bones back into place    Prior to Admission medications   Medication Sig Start Date End Date Taking? Authorizing Provider  losartan  (COZAAR ) 25 MG tablet Take 1 tablet by mouth daily. 09/13/23  Yes Cleotilde Perkins, DO  Turmeric (QC TUMERIC COMPLEX PO) Take by mouth. Patient taking differently: Take 2 capsules by mouth daily.   Yes [provider]  Vitamin D , Ergocalciferol , (DRISDOL ) 1.25 MG (50000 UNIT) CAPS capsule Take 50,000 Units by mouth every 7 (seven) days.   Yes [provider]    Current Outpatient Medications  Medication Sig Dispense Refill   losartan  (COZAAR ) 25 MG tablet Take 1 tablet by mouth daily. 100 tablet 3   Turmeric (QC TUMERIC COMPLEX PO) Take by mouth. (Patient taking differently: Take 2 capsules by mouth daily.)     Vitamin D , Ergocalciferol , (DRISDOL ) 1.25 MG (50000 UNIT) CAPS capsule Take 50,000 Units by mouth every 7 (seven) days.     Current Facility-Administered Medications  Medication Dose Route Frequency Provider Last Rate Last Admin   0.9 %  sodium chloride  infusion  500 mL Intravenous Continuous Lavoris Canizales V, DO        Allergies as of 01/08/2024 - Review Complete 01/08/2024  Allergen Reaction Noted   Bee venom Anaphylaxis 06/07/2014   Penicillins Anaphylaxis and Shortness Of Breath 06/07/2014  Family History  Problem Relation Age of Onset   Thyroid  disease Mother    Skin cancer Father        dx 56s (multiple)   Cancer Father    High blood pressure Father    Heart disease Father    Skin cancer Sister        dx 28s   Cancer Sister    Cystic fibrosis Brother    Arthritis Paternal Grandmother    Esophageal cancer Neg Hx    Stomach cancer Neg Hx    Rectal cancer Neg Hx    Colon polyps Neg Hx    Colon cancer Neg Hx     Social History   Socioeconomic History   Marital status: Significant Other    Spouse name: Not on file   Number of  children: Not on file   Years of education: Not on file   Highest education level: Bachelor's degree (e.g., BA, AB, BS)  Occupational History   Not on file  Tobacco Use   Smoking status: Former    Passive exposure: Past   Smokeless tobacco: Never   Tobacco comments:    quit 20 years ago  Vaping Use   Vaping status: Never Used  Substance and Sexual Activity   Alcohol use: Not Currently   Drug use: Never   Sexual activity: Not Currently    Partners: Male    Birth control/protection: Post-menopausal  Other Topics Concern   Not on file  Social History Narrative   Brother died from cystic fibrosis about 37.    Husband, Arley recently passed away Oct 07, 2023.   Social Drivers of Corporate investment banker Strain: Low Risk  (11/02/2023)   Overall Financial Resource Strain (CARDIA)    Difficulty of Paying Living Expenses: Not hard at all  Food Insecurity: No Food Insecurity (11/02/2023)   Hunger Vital Sign    Worried About Running Out of Food in the Last Year: Never true    Ran Out of Food in the Last Year: Never true  Transportation Needs: No Transportation Needs (11/02/2023)   PRAPARE - Administrator, Civil Service (Medical): No    Lack of Transportation (Non-Medical): No  Physical Activity: Sufficiently Active (11/02/2023)   Exercise Vital Sign    Days of Exercise per Week: 4 days    Minutes of Exercise per Session: 130 min  Stress: Stress Concern Present (11/02/2023)   [employer] of Occupational Health - Occupational Stress Questionnaire    Feeling of Stress: To some extent  Social Connections: Unknown (11/02/2023)   Social Connection and Isolation Panel    Frequency of Communication with Friends and Family: More than three times a week    Frequency of Social Gatherings with Friends and Family: More than three times a week    Attends Religious Services: Patient declined    Database administrator or Organizations: Patient declined    Attends Tax inspector Meetings: Never    Marital Status: Patient declined  Intimate Partner Violence: Not At Risk (11/02/2023)   Humiliation, Afraid, Rape, and Kick questionnaire    Fear of Current or Ex-Partner: No    Emotionally Abused: No    Physically Abused: No    Sexually Abused: No    Physical Exam: Vital signs in last 24 hours: @BP  135/75   Pulse 100   Temp 98.1 F (36.7 C) (Temporal)   Ht 5' 6 (1.676 m)   Wt 185 lb (83.9 kg)  SpO2 95%   BMI 29.86 kg/m  GEN: NAD EYE: Sclerae anicteric ENT: MMM CV: Non-tachycardic Pulm: CTA b/l GI: Soft, NT/ND NEURO:  Alert & Oriented x 3   Sandor Flatter, DO [facility]   01/08/2024 10:42 AM

## 2024-01-08 NOTE — Progress Notes (Signed)
 Called to room to assist during endoscopic procedure.  Patient ID and intended procedure confirmed with present staff. Received instructions for my participation in the procedure from the performing physician.

## 2024-01-09 ENCOUNTER — Telehealth: Payer: Self-pay

## 2024-01-09 NOTE — Telephone Encounter (Signed)
  Follow up Call-     01/08/2024   10:01 AM  Call back number  Post procedure Call Back phone  # [phone]  Permission to leave phone message Yes     Patient questions:  Do you have a fever, pain , or abdominal swelling? No. Pain Score  0 *  Have you tolerated food without any problems? Yes.    Have you been able to return to your normal activities? Yes.    Do you have any questions about your discharge instructions: Diet   No. Medications  No. Follow up visit  No.  Do you have questions or concerns about your Care? No.  Actions: * If pain score is 4 or above: No action needed, pain <4.

## 2024-01-10 ENCOUNTER — Ambulatory Visit: Payer: Self-pay | Admitting: Gastroenterology

## 2024-01-10 LAB — SURGICAL PATHOLOGY

## 2024-02-01 NOTE — Progress Notes (Signed)
 Shabre Kreher                                          MRN: [id number]   02/01/2024   The [facility] Quality Team Specialist reviewed this patient medical record for the purposes of chart review for care gap closure. The following were reviewed: chart review for care gap closure-controlling blood pressure.    [facility] Quality Team

## 2024-02-29 ENCOUNTER — Encounter

## 2024-03-27 ENCOUNTER — Ambulatory Visit (INDEPENDENT_AMBULATORY_CARE_PROVIDER_SITE_OTHER): Payer: Self-pay | Admitting: Student

## 2024-03-27 VITALS — BP 145/95 | HR 87 | Wt 200.2 lb

## 2024-03-27 DIAGNOSIS — R42 Dizziness and giddiness: Secondary | ICD-10-CM

## 2024-03-27 DIAGNOSIS — Z0000 Encounter for general adult medical examination without abnormal findings: Secondary | ICD-10-CM

## 2024-03-27 LAB — POCT GLYCOSYLATED HEMOGLOBIN (HGB A1C): HbA1c POC (<> result, manual entry): 5.8 %

## 2024-03-27 NOTE — Progress Notes (Signed)
" ° ° °  SUBJECTIVE:   CHIEF COMPLAINT / HPI:   Laura Kaufman is a 69 y.o. female presenting for follow up.   Recent grief reaction from losing her long term partner late last year  She has gained 15 pounds and experiences some distress over this She is ready to begin weight loss changes and lifestyle modifications   Dizziness:  - reports light headed feeling every time she stands up  - does not seem to be affected by certain positions of her head - no ringing in her ears - feels like it may be related to her blood pressure   PERTINENT  PMH / PSH: reviewed and updated.  OBJECTIVE:   BP (!) 145/95   Pulse 87   Wt 200 lb 3.2 oz (90.8 kg)   SpO2 97%   BMI 32.31 kg/m   Well-appearing, no acute distress Cardio: Regular rate, regular rhythm, no murmurs on exam. Pulm: Clear, no wheezing, no crackles. No increased work of breathing Abdominal: bowel sounds present, soft, non-tender, non-distended Extremities: no peripheral edema  Neuro: alert and oriented x3, speech normal in content, no facial asymmetry, strength intact and equal bilaterally in UE and LE, pupils equal and reactive to light.  Psych:  Cognition and judgment appear intact. Alert, communicative  and cooperative with normal attention span and concentration. No apparent delusions, illusions, hallucinations    ASSESSMENT/PLAN:   Assessment & Plan Dizziness Health care maintenance Suspect orthostatic hypotension, Patient had a 10 mmHg drop from sitting to standing  BP is still within a normal range while standing Continue Losartan  25 mg daily  Instructed patient to continue hydration, caution when standing  Check BMP, CBC, TSH today to rule out other causes  Checked A1c today 5.8 in prediabetic range      Damien Pinal, DO [facility]  "

## 2024-03-27 NOTE — Patient Instructions (Signed)
 It was great to see you today!   I have ordered lab work today. I will send you a message through MyChart or send you a letter with your results. If there is an abnormal result, I will give you a call.    Future Appointments  Date Time Provider Department Center  11/04/2024  8:30 AM [facility] ANNUAL WELLNESS VISIT [facility]    Please arrive 15 minutes before your appointment to ensure smooth check in process.  If you are more than 15 minutes late, you may be asked to reschedule.   Please bring a list of your medications with you to all appointments.   Please call the clinic at [phone] if your symptoms worsen or you have any concerns.  Thank you for allowing me to participate in your care, Dr. Damien Pinal [facility]

## 2024-03-28 LAB — BASIC METABOLIC PANEL WITH GFR
BUN/Creatinine Ratio: 11 — ABNORMAL LOW (ref 12–28)
BUN: 10 mg/dL (ref 8–27)
CO2: 22 mmol/L (ref 20–29)
Calcium: 10.3 mg/dL (ref 8.7–10.3)
Chloride: 103 mmol/L (ref 96–106)
Creatinine, Ser: 0.88 mg/dL (ref 0.57–1.00)
Glucose: 94 mg/dL (ref 70–99)
Potassium: 4.6 mmol/L (ref 3.5–5.2)
Sodium: 141 mmol/L (ref 134–144)
eGFR: 71 mL/min/1.73

## 2024-03-28 LAB — TSH RFX ON ABNORMAL TO FREE T4: TSH: 1.65 u[IU]/mL (ref 0.450–4.500)

## 2024-03-28 LAB — CBC
Hematocrit: 48.9 % — ABNORMAL HIGH (ref 34.0–46.6)
Hemoglobin: 16.2 g/dL — ABNORMAL HIGH (ref 11.1–15.9)
MCH: 28.2 pg (ref 26.6–33.0)
MCHC: 33.1 g/dL (ref 31.5–35.7)
MCV: 85 fL (ref 79–97)
Platelets: 274 x10E3/uL (ref 150–450)
RBC: 5.75 x10E6/uL — ABNORMAL HIGH (ref 3.77–5.28)
RDW: 12.7 % (ref 11.7–15.4)
WBC: 8.8 x10E3/uL (ref 3.4–10.8)

## 2024-03-29 ENCOUNTER — Ambulatory Visit (HOSPITAL_COMMUNITY): Payer: Self-pay | Admitting: Student

## 2024-04-04 ENCOUNTER — Encounter: Payer: Self-pay | Admitting: Pharmacist

## 2024-04-04 NOTE — Progress Notes (Signed)
 This patient is appearing on a report for being at risk of failing the adherence measure for diabetes medications this calendar year.   Medication: Trulicity  (dulaglutide )  Per chart review, medication was discontinued in August 2025 due to kidney stones and UTI symptoms.   Reviewed medication indication, dosing, and goals of therapy. No action needed at this time.

## 2024-11-04 ENCOUNTER — Encounter
